# Patient Record
Sex: Male | Born: 1960 | Race: Black or African American | Hispanic: No | Smoking: Never smoker
Health system: Southern US, Community
[De-identification: ages and names within clinical notes are randomized; demographics above are authoritative.]

## PROBLEM LIST (undated history)

## (undated) DIAGNOSIS — R413 Other amnesia: Secondary | ICD-10-CM

## (undated) DIAGNOSIS — R410 Disorientation, unspecified: Secondary | ICD-10-CM

## (undated) DIAGNOSIS — H539 Unspecified visual disturbance: Secondary | ICD-10-CM

## (undated) DIAGNOSIS — F0280 Dementia in other diseases classified elsewhere without behavioral disturbance: Secondary | ICD-10-CM

## (undated) DIAGNOSIS — R51 Headache: Secondary | ICD-10-CM

## (undated) DIAGNOSIS — R519 Headache, unspecified: Secondary | ICD-10-CM

## (undated) DIAGNOSIS — H9190 Unspecified hearing loss, unspecified ear: Secondary | ICD-10-CM

## (undated) DIAGNOSIS — G309 Alzheimer's disease, unspecified: Secondary | ICD-10-CM

## (undated) HISTORY — DX: Unspecified visual disturbance: H53.9

## (undated) HISTORY — PX: OTHER SURGICAL HISTORY: SHX169

## (undated) HISTORY — DX: Disorientation, unspecified: R41.0

## (undated) HISTORY — DX: Headache: R51

## (undated) HISTORY — DX: Other amnesia: R41.3

## (undated) HISTORY — DX: Headache, unspecified: R51.9

## (undated) HISTORY — DX: Unspecified hearing loss, unspecified ear: H91.90

---

## 2005-06-18 ENCOUNTER — Encounter: Admission: RE | Admit: 2005-06-18 | Discharge: 2005-06-18 | Payer: Self-pay | Admitting: Orthopedic Surgery

## 2014-08-23 ENCOUNTER — Emergency Department (HOSPITAL_COMMUNITY): Payer: Self-pay

## 2014-08-23 ENCOUNTER — Emergency Department (HOSPITAL_COMMUNITY)
Admission: EM | Admit: 2014-08-23 | Discharge: 2014-08-23 | Disposition: A | Discharge status: Home / Self Care | Payer: Self-pay | Attending: Emergency Medicine | Admitting: Emergency Medicine

## 2014-08-23 ENCOUNTER — Encounter (HOSPITAL_COMMUNITY): Payer: Self-pay | Admitting: *Deleted

## 2014-08-23 DIAGNOSIS — Z008 Encounter for other general examination: Secondary | ICD-10-CM | POA: Insufficient documentation

## 2014-08-23 DIAGNOSIS — Z590 Homelessness: Secondary | ICD-10-CM | POA: Insufficient documentation

## 2014-08-23 DIAGNOSIS — R413 Other amnesia: Secondary | ICD-10-CM

## 2014-08-23 DIAGNOSIS — R4182 Altered mental status, unspecified: Secondary | ICD-10-CM

## 2014-08-23 DIAGNOSIS — R410 Disorientation, unspecified: Secondary | ICD-10-CM | POA: Insufficient documentation

## 2014-08-23 LAB — CBC
HEMATOCRIT: 42.5 % (ref 39.0–52.0)
Hemoglobin: 13.6 g/dL (ref 13.0–17.0)
MCH: 30 pg (ref 26.0–34.0)
MCHC: 32 g/dL (ref 30.0–36.0)
MCV: 93.8 fL (ref 78.0–100.0)
PLATELETS: 250 10*3/uL (ref 150–400)
RBC: 4.53 MIL/uL (ref 4.22–5.81)
RDW: 13.7 % (ref 11.5–15.5)
WBC: 4.1 10*3/uL (ref 4.0–10.5)

## 2014-08-23 LAB — COMPREHENSIVE METABOLIC PANEL
ALK PHOS: 38 U/L — AB (ref 39–117)
ALT: 19 U/L (ref 0–53)
ANION GAP: 13 (ref 5–15)
AST: 21 U/L (ref 0–37)
Albumin: 4 g/dL (ref 3.5–5.2)
BUN: 11 mg/dL (ref 6–23)
CHLORIDE: 106 meq/L (ref 96–112)
CO2: 28 mEq/L (ref 19–32)
Calcium: 9.7 mg/dL (ref 8.4–10.5)
Creatinine, Ser: 0.97 mg/dL (ref 0.50–1.35)
Glucose, Bld: 118 mg/dL — ABNORMAL HIGH (ref 70–99)
POTASSIUM: 4.1 meq/L (ref 3.7–5.3)
SODIUM: 147 meq/L (ref 137–147)
TOTAL PROTEIN: 7.1 g/dL (ref 6.0–8.3)
Total Bilirubin: 0.7 mg/dL (ref 0.3–1.2)

## 2014-08-23 LAB — TSH: TSH: 4.51 u[IU]/mL — ABNORMAL HIGH (ref 0.350–4.500)

## 2014-08-23 LAB — URINALYSIS, ROUTINE W REFLEX MICROSCOPIC
BILIRUBIN URINE: NEGATIVE
Glucose, UA: NEGATIVE mg/dL
Hgb urine dipstick: NEGATIVE
Ketones, ur: NEGATIVE mg/dL
LEUKOCYTES UA: NEGATIVE
Nitrite: NEGATIVE
Protein, ur: NEGATIVE mg/dL
SPECIFIC GRAVITY, URINE: 1.034 — AB (ref 1.005–1.030)
UROBILINOGEN UA: 1 mg/dL (ref 0.0–1.0)
pH: 6 (ref 5.0–8.0)

## 2014-08-23 NOTE — ED Provider Notes (Signed)
CSN: [id number]     Arrival date & time 08/23/14  1543 History   First MD Initiated Contact with Patient 08/23/14 1808     Chief Complaint  Patient presents with  . Altered Mental Status  . Medical Clearance     (Consider location/radiation/quality/duration/timing/severity/associated sxs/prior Treatment) Patient is a 53 y.o. male presenting with altered mental status. The history is provided by the patient and a relative. No language interpreter was used.  Altered Mental Status Presenting symptoms: confusion, disorientation and memory loss   Severity:  Moderate Most recent episode:  Today Episode history:  Multiple Duration:  12 months Timing:  Constant Progression:  Worsening Chronicity:  Recurrent Context: not alcohol use, not drug use, not a recent change in medication and not a recent illness   Associated symptoms: no headaches     History reviewed. No pertinent past medical history. History reviewed. No pertinent past surgical history. History reviewed. No pertinent family history. History  Substance Use Topics  . Smoking status: Not on file  . Smokeless tobacco: Not on file  . Alcohol Use: No    Review of Systems  Neurological: Negative for headaches.  Psychiatric/Behavioral: Positive for memory loss, confusion and decreased concentration. Negative for suicidal ideas and self-injury.  All other systems reviewed and are negative.     Allergies  Review of patient's allergies indicates no known allergies.  Home Medications   Prior to Admission medications   Not on File   BP 140/88 mmHg  Pulse 54  Temp(Src) 97.8 F (36.6 C) (Oral)  Resp 18  SpO2 100% Physical Exam  Constitutional: He appears well-developed and well-nourished.  HENT:  Head: Normocephalic and atraumatic.  Eyes: Pupils are equal, round, and reactive to light.  Neck: Neck supple. Carotid bruit is not present.  Cardiovascular: Normal rate, regular rhythm and intact distal pulses.    Pulmonary/Chest: Effort normal and breath sounds normal.  Abdominal: Soft. Bowel sounds are normal.  Musculoskeletal: He exhibits no edema or tenderness.  Lymphadenopathy:    He has no cervical adenopathy.  Neurological: He is alert. He has normal strength. No cranial nerve deficit or sensory deficit. Coordination and gait normal. GCS eye subscore is 4. GCS verbal subscore is 5. GCS motor subscore is 6.  Skin: Skin is warm and dry.  Psychiatric: He has a normal mood and affect.  Nursing note and vitals reviewed.   ED Course  Procedures (including critical care time) Labs Review Labs Reviewed  COMPREHENSIVE METABOLIC PANEL - Abnormal; Notable for the following:    Glucose, Bld 118 (*)    Alkaline Phosphatase 38 (*)    All other components within normal limits  CBC  URINALYSIS, ROUTINE W REFLEX MICROSCOPIC    Imaging Review No results found.   EKG Interpretation None     Lab and radiology results reviewed. No acute intracranial findings on MRI. Labs unremarkable except for alk phos of 38.  Current concern for neurologic component of symptoms.  Will confer with neurohospitalist-Dr.Kirkpatrick to see patient. Patient seen by neurohospitalist--recommends outpatient pyschometric testing, and has requested additional labs to aid in next phase of evaluation.  Referral to [facility] neurology provided. MDM   Final diagnoses:  None    Confusion.    Norman Herrlich, NP 08/24/14 0940  Orpah Greek, MD 08/24/14 Pauline Aus

## 2014-08-23 NOTE — ED Notes (Signed)
Pt and family member reports pt having memory loss for extended amount of time, progressively getting worse. Pt had personal stress that may have contributed, went to see a psychiatrist and was told to come here for medical screening. Pt denies any SI or Hi at triage. No acute distress noted.
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
## 2014-08-23 NOTE — ED Notes (Signed)
Patient transported to MRI 

## 2014-08-23 NOTE — Discharge Instructions (Signed)
Confusion Confusion is the inability to think with your usual speed or clarity. Confusion may come on quickly or slowly over time. How quickly the confusion comes on depends on the cause. Confusion can be due to any number of causes. CAUSES   Concussion, head injury, or head trauma.  Seizures.  Stroke.  Fever.  Brain tumor.  Age related decreased brain function (dementia).  Heightened emotional states like rage or terror.  Mental illness in which the person loses the ability to determine what is real and what is not (hallucinations).  Infections such as a urinary tract infection (UTI).  Toxic effects from alcohol, drugs, or prescription medicines.  Dehydration and an imbalance of salts in the body (electrolytes).  Lack of sleep.  Low blood sugar (diabetes).  Low levels of oxygen from conditions such as chronic lung disorders.  Drug interactions or other medicine side effects.  Nutritional deficiencies, especially niacin, thiamine, vitamin C, or vitamin B.  Sudden drop in body temperature (hypothermia).  Change in routine, such as when traveling or hospitalized. SIGNS AND SYMPTOMS  People often describe their thinking as cloudy or unclear when they are confused. Confusion can also include feeling disoriented. That means you are unaware of where or who you are. You may also not know what the date or time is. If confused, you may also have difficulty paying attention, remembering, and making decisions. Some people also act aggressively when they are confused.  DIAGNOSIS  The medical evaluation of confusion may include:  Blood and urine tests.  X-rays.  Brain and nervous system tests.  Analyzing your brain waves (electroencephalogram or EEG).  Magnetic resonance imaging (MRI) of your head.  Computed tomography (CT) scan of your head.  Mental status tests in which your health care provider may ask many questions. Some of these questions may seem silly or strange,  but they are a very important test to help diagnose and treat confusion. TREATMENT  An admission to the hospital may not be needed, but a person with confusion should not be left alone. Stay with a family member or friend until the confusion clears. Avoid alcohol, pain relievers, or sedative drugs until you have fully recovered. Do not drive until directed by your health care provider. HOME CARE INSTRUCTIONS  What family and friends can do:  To find out if someone is confused, ask the person to state his or her name, age, and the date. If the person is unsure or answers incorrectly, he or she is confused.  Always introduce yourself, no matter how well the person knows you.  Often remind the person of his or her location.  Place a calendar and clock near the confused person.  Help the person with his or her medicines. You may want to use a pill box, an alarm as a reminder, or give the person each dose as prescribed.  Talk about current events and plans for the day.  Try to keep the environment calm, quiet, and peaceful.  Make sure the person keeps follow-up visits with his or her health care provider. PREVENTION  Ways to prevent confusion:  Avoid alcohol.  Eat a balanced diet.  Get enough sleep.  Take medicine only as directed by your health care provider.  Do not become isolated. Spend time with other people and make plans for your days.  Keep careful watch on your blood sugar levels if you are diabetic. SEEK IMMEDIATE MEDICAL CARE IF:   You develop severe headaches, repeated vomiting, seizures, blackouts, or   slurred speech.  There is increasing confusion, weakness, numbness, restlessness, or personality changes.  You develop a loss of balance, have marked dizziness, feel uncoordinated, or fall.  You have delusions, hallucinations, or develop severe anxiety.  Your family members think you need to be rechecked. Document Released: 11/11/2004 Document Revised: 02/18/2014  Document Reviewed: 11/09/2013 Exit[employer] Patient Information 2015 [employer]. This information is not intended to replace advice given to you by your health care provider. Make sure you discuss any questions you have with your health care provider.  

## 2014-08-23 NOTE — ED Notes (Signed)
Neruo Md at bedside

## 2014-08-24 DIAGNOSIS — R413 Other amnesia: Secondary | ICD-10-CM

## 2014-08-24 LAB — RPR

## 2014-08-24 LAB — VITAMIN B12: Vitamin B-12: 215 pg/mL (ref 211–911)

## 2014-08-27 ENCOUNTER — Ambulatory Visit (INDEPENDENT_AMBULATORY_CARE_PROVIDER_SITE_OTHER): Payer: Self-pay | Admitting: Neurology

## 2014-08-27 ENCOUNTER — Encounter: Payer: Self-pay | Admitting: Neurology

## 2014-08-27 VITALS — BP 154/75 | HR 55 | Temp 97.0°F | Ht 68.0 in | Wt 146.0 lb

## 2014-08-27 DIAGNOSIS — F329 Major depressive disorder, single episode, unspecified: Secondary | ICD-10-CM

## 2014-08-27 DIAGNOSIS — F32A Depression, unspecified: Secondary | ICD-10-CM

## 2014-08-27 DIAGNOSIS — R410 Disorientation, unspecified: Secondary | ICD-10-CM | POA: Insufficient documentation

## 2014-08-27 DIAGNOSIS — Z658 Other specified problems related to psychosocial circumstances: Secondary | ICD-10-CM

## 2014-08-27 DIAGNOSIS — E538 Deficiency of other specified B group vitamins: Secondary | ICD-10-CM

## 2014-08-27 DIAGNOSIS — R7989 Other specified abnormal findings of blood chemistry: Secondary | ICD-10-CM

## 2014-08-27 DIAGNOSIS — R946 Abnormal results of thyroid function studies: Secondary | ICD-10-CM

## 2014-08-27 NOTE — Patient Instructions (Addendum)
Overall you are doing fairly well but I do want to suggest a few things today:   Remember to drink plenty of fluid, eat healthy meals and do not skip any meals. Try to eat protein with a every meal and eat a healthy snack such as fruit or nuts in between meals. Try to keep a regular sleep-wake schedule and try to exercise daily, particularly in the form of walking, 20-30 minutes a day, if you can.   As far as your medications are concerned, I would like to suggest: daily supplementation with B12 (1000mcg twice daily), thiamine 100mg  daily and folate 1mg /day until can be further tested  As far as diagnostic testing: Will provide recommendations in documentation for sister to bring to free clinic(Will email recommendations to sister,  [email or web address])  I would like to see you back in 6 weeks to 3 months, sooner if we need to. Please call us with any interim questions, concerns, problems, updates or refill requests.   My clinical assistant and will answer any of your questions and relay your messages to me and also relay most of my messages to you.   Our phone number is [phone]. We also have an after hours call service for urgent matters and there is a physician on-call for urgent questions. For any emergencies you know to call 911 or go to the nearest emergency room
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
## 2014-08-28 ENCOUNTER — Ambulatory Visit: Payer: Self-pay | Admitting: Neurology

## 2014-08-28 LAB — VITAMIN B1: VITAMIN B1 (THIAMINE): 8 nmol/L (ref 8–30)

## 2014-11-28 ENCOUNTER — Ambulatory Visit: Payer: Self-pay | Admitting: Neurology

## 2014-12-04 ENCOUNTER — Encounter: Payer: Self-pay | Admitting: Neurology

## 2014-12-24 DIAGNOSIS — Z0289 Encounter for other administrative examinations: Secondary | ICD-10-CM

## 2015-01-24 ENCOUNTER — Ambulatory Visit (INDEPENDENT_AMBULATORY_CARE_PROVIDER_SITE_OTHER): Payer: Self-pay | Admitting: Neurology

## 2015-01-24 ENCOUNTER — Encounter: Payer: Self-pay | Admitting: Neurology

## 2015-01-24 VITALS — BP 131/79 | HR 73 | Temp 97.0°F | Ht 68.0 in | Wt 145.0 lb

## 2015-01-24 DIAGNOSIS — F0390 Unspecified dementia without behavioral disturbance: Secondary | ICD-10-CM
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
## 2015-01-24 NOTE — Patient Instructions (Signed)
Overall you are doing fairly well but I do want to suggest a few things today:   Remember to drink plenty of fluid, eat healthy meals and do not skip any meals. Try to eat protein with a every meal and eat a healthy snack such as fruit or nuts in between meals. Try to keep a regular sleep-wake schedule and try to exercise daily, particularly in the form of walking, 20-30 minutes a day, if you can.   As far as diagnostic testing: MRI of the brain w/wo contrast, FDG-glucose PET scan, Extensive Lab testing, Lumbar puncture, Neurocognitive testing, EEG   I would like to see you back in after testing is complete, sooner if we need to. Please call us with any interim questions, concerns, problems, updates or refill requests.   Please also call us for any test results so we can go over those with you on the phone.  My clinical assistant and will answer any of your questions and relay your messages to me and also relay most of my messages to you.   Our phone number is [phone]. We also have an after hours call service for urgent matters and there is a physician on-call for urgent questions. For any emergencies you know to call 911 or go to the nearest emergency room

## 2015-01-26 DIAGNOSIS — F0390 Unspecified dementia without behavioral disturbance: Secondary | ICD-10-CM | POA: Insufficient documentation

## 2015-01-27 ENCOUNTER — Other Ambulatory Visit (INDEPENDENT_AMBULATORY_CARE_PROVIDER_SITE_OTHER): Payer: Self-pay

## 2015-01-27 DIAGNOSIS — F0390 Unspecified dementia without behavioral disturbance: Secondary | ICD-10-CM

## 2015-01-27 DIAGNOSIS — Z0289 Encounter for other administrative examinations: Secondary | ICD-10-CM

## 2015-01-28 ENCOUNTER — Telehealth: Payer: Self-pay | Admitting: *Deleted

## 2015-01-28 NOTE — Telephone Encounter (Signed)
Spoke with the pt and the sister and informed them that he would need to come back to the office to get some more blood work drawn. Also updated the contact number. They stated and understanding

## 2015-01-30 ENCOUNTER — Other Ambulatory Visit (INDEPENDENT_AMBULATORY_CARE_PROVIDER_SITE_OTHER): Payer: Self-pay

## 2015-01-30 ENCOUNTER — Other Ambulatory Visit: Payer: Self-pay | Admitting: *Deleted

## 2015-01-30 DIAGNOSIS — Z0289 Encounter for other administrative examinations: Secondary | ICD-10-CM

## 2015-01-30 DIAGNOSIS — R413 Other amnesia: Secondary | ICD-10-CM

## 2015-01-30 LAB — COMPREHENSIVE METABOLIC PANEL
A/G RATIO: 1.7 (ref 1.1–2.5)
ALT: 29 IU/L (ref 0–44)
AST: 33 IU/L (ref 0–40)
Albumin: 4.6 g/dL (ref 3.5–5.5)
Alkaline Phosphatase: 42 IU/L (ref 39–117)
BUN/Creatinine Ratio: 12 (ref 9–20)
BUN: 10 mg/dL (ref 6–24)
Bilirubin Total: 0.9 mg/dL (ref 0.0–1.2)
CALCIUM: 9.8 mg/dL (ref 8.7–10.2)
CO2: 28 mmol/L (ref 18–29)
Chloride: 101 mmol/L (ref 97–108)
Creatinine, Ser: 0.86 mg/dL (ref 0.76–1.27)
GFR calc non Af Amer: 99 mL/min/{1.73_m2} (ref >59)
GFR, EST AFRICAN AMERICAN: 114 mL/min/{1.73_m2} (ref >59)
GLOBULIN, TOTAL: 2.7 g/dL (ref 1.5–4.5)
GLUCOSE: 93 mg/dL (ref 65–99)
Potassium: 4.2 mmol/L (ref 3.5–5.2)
Sodium: 143 mmol/L (ref 134–144)
Total Protein: 7.3 g/dL (ref 6.0–8.5)

## 2015-01-30 LAB — MULTIPLE MYELOMA PANEL, SERUM
ALPHA2 GLOB SERPL ELPH-MCNC: 0.5 g/dL (ref 0.4–1.2)
Albumin SerPl Elph-Mcnc: 4.1 g/dL (ref 3.2–5.6)
Albumin/Glob SerPl: 1.3 (ref 0.7–2.0)
Alpha 1: 0.2 g/dL (ref 0.1–0.4)
B-GLOBULIN SERPL ELPH-MCNC: 1.1 g/dL (ref 0.6–1.3)
GAMMA GLOB SERPL ELPH-MCNC: 1.3 g/dL (ref 0.5–1.6)
GLOBULIN, TOTAL: 3.2 g/dL (ref 2.0–4.5)
IGG (IMMUNOGLOBIN G), SERUM: 1235 mg/dL (ref 700–1600)
IGM (IMMUNOGLOBULIN M), SRM: 79 mg/dL (ref 20–172)

## 2015-01-30 LAB — HEAVY METALS, BLOOD
Arsenic: 4 ug/L (ref 2–23)
Lead, Blood: NOT DETECTED ug/dL (ref 0–19)
Mercury: 1.6 ug/L (ref 0.0–14.9)

## 2015-01-30 LAB — ANA W/REFLEX: Anti Nuclear Antibody(ANA): NEGATIVE

## 2015-01-30 LAB — CBC
HEMATOCRIT: 43.5 % (ref 37.5–51.0)
Hemoglobin: 14.2 g/dL (ref 12.6–17.7)
MCH: 29.6 pg (ref 26.6–33.0)
MCHC: 32.6 g/dL (ref 31.5–35.7)
MCV: 91 fL (ref 79–97)
PLATELETS: 244 10*3/uL (ref 150–379)
RBC: 4.8 x10E6/uL (ref 4.14–5.80)
RDW: 13.7 % (ref 12.3–15.4)
WBC: 3.9 10*3/uL (ref 3.4–10.8)

## 2015-01-30 LAB — CELIAC PANEL 10
Antigliadin Abs, IgA: 5 units (ref 0–19)
Endomysial IgA: NEGATIVE
Gliadin IgG: 3 units (ref 0–19)
IgA/Immunoglobulin A, Serum: 309 mg/dL (ref 90–386)
Transglutaminase IgA: <2 U/mL (ref 0–3)

## 2015-01-30 LAB — PAN-ANCA
Atypical pANCA: <1:20 {titer}
C-ANCA: <1:20 {titer}
Myeloperoxidase Ab: <9 U/mL (ref 0.0–9.0)

## 2015-01-30 LAB — HOMOCYSTEINE: Homocysteine: 10.6 umol/L (ref 0.0–15.0)

## 2015-01-30 LAB — METHYLMALONIC ACID, SERUM: Methylmalonic Acid: 160 nmol/L (ref 0–378)

## 2015-01-30 LAB — COPPER, SERUM: Copper: 85 ug/dL (ref 72–166)

## 2015-01-30 LAB — CERULOPLASMIN: Ceruloplasmin: 14.9 mg/dL — ABNORMAL LOW (ref 16.0–31.0)

## 2015-01-30 LAB — B12 AND FOLATE PANEL
Folate: 16.2 ng/mL (ref >3.0)
VITAMIN B 12: 714 pg/mL (ref 211–946)

## 2015-01-30 LAB — THYROID PANEL WITH TSH
Free Thyroxine Index: 1.8 (ref 1.2–4.9)
T3 UPTAKE RATIO: 24 % (ref 24–39)
T4, Total: 7.7 ug/dL (ref 4.5–12.0)
TSH: 2.02 u[IU]/mL (ref 0.450–4.500)

## 2015-01-30 LAB — HIV ANTIBODY (ROUTINE TESTING W REFLEX): HIV Screen 4th Generation wRfx: NONREACTIVE

## 2015-01-30 LAB — ANGIOTENSIN CONVERTING ENZYME: Angio Convert Enzyme: 24 U/L (ref 14–82)

## 2015-01-30 LAB — VITAMIN B1

## 2015-01-30 LAB — PARANEOPLASTIC PROFILE 1
Neuronal Nuc Ab (Ri), IFA: <1:10 {titer}
Neuronal Nuclear (Hu) Antibody (IB): <1:10 {titer}

## 2015-01-30 LAB — SEDIMENTATION RATE: Sed Rate: 2 mm/hr (ref 0–30)

## 2015-01-30 LAB — AMMONIA: AMMONIA: 68 ug/dL (ref 27–102)

## 2015-01-30 LAB — RPR: RPR Ser Ql: NONREACTIVE

## 2015-01-30 LAB — C-REACTIVE PROTEIN: CRP: 0.3 mg/L (ref 0.0–4.9)

## 2015-01-30 LAB — VITAMIN B6: Vitamin B6: 67 ug/L — ABNORMAL HIGH (ref 5.3–46.7)

## 2015-01-30 LAB — MAGNESIUM: Magnesium: 2.2 mg/dL (ref 1.6–2.3)

## 2015-02-01 LAB — VITAMIN B1: Thiamine: 116.3 nmol/L (ref 66.5–200.0)

## 2015-02-03 ENCOUNTER — Other Ambulatory Visit: Payer: Self-pay | Admitting: Neurology

## 2015-02-03 DIAGNOSIS — F0390 Unspecified dementia without behavioral disturbance: Secondary | ICD-10-CM

## 2015-02-04 ENCOUNTER — Telehealth: Payer: Self-pay | Admitting: *Deleted

## 2015-02-04 NOTE — Telephone Encounter (Signed)
Talked with sister about pt lab results per Dr. Lucia GaskinsAhern notes. I told her that the ceruloplasmin was a little low but the copper was normal and Dr. Lucia GaskinsAhern believed this was negligible.  Joy verbalized understanding.

## 2015-02-12 ENCOUNTER — Other Ambulatory Visit: Payer: Self-pay | Admitting: Neurology

## 2015-02-12 ENCOUNTER — Telehealth: Payer: Self-pay | Admitting: Neurology

## 2015-02-12 ENCOUNTER — Ambulatory Visit (HOSPITAL_COMMUNITY)
Admission: RE | Admit: 2015-02-12 | Discharge: 2015-02-12 | Disposition: A | Discharge status: Home / Self Care | Payer: Self-pay | Source: Ambulatory Visit | Attending: Neurology | Admitting: Neurology

## 2015-02-12 ENCOUNTER — Encounter (HOSPITAL_COMMUNITY)
Admission: RE | Admit: 2015-02-12 | Discharge: 2015-02-12 | Disposition: A | Discharge status: Home / Self Care | Payer: Self-pay | Source: Ambulatory Visit | Attending: Neurology | Admitting: Neurology

## 2015-02-12 DIAGNOSIS — G3189 Other specified degenerative diseases of nervous system: Secondary | ICD-10-CM | POA: Insufficient documentation

## 2015-02-12 DIAGNOSIS — R410 Disorientation, unspecified: Secondary | ICD-10-CM | POA: Insufficient documentation

## 2015-02-12 DIAGNOSIS — R413 Other amnesia: Secondary | ICD-10-CM | POA: Insufficient documentation

## 2015-02-12 DIAGNOSIS — F0390 Unspecified dementia without behavioral disturbance: Secondary | ICD-10-CM

## 2015-02-12 DIAGNOSIS — H532 Diplopia: Secondary | ICD-10-CM | POA: Insufficient documentation

## 2015-02-12 MED ORDER — DONEPEZIL HCL 5 MG PO TABS: 5.0000 mg | ORAL_TABLET | Freq: Every day | ORAL | Status: DC

## 2015-02-12 MED ORDER — FLUDEOXYGLUCOSE F - 18 (FDG) INJECTION
10.1000 | Freq: Once | INTRAVENOUS | Status: AC | PRN
  Administered 2015-02-12: 10.1 via INTRAVENOUS

## 2015-02-12 NOTE — Telephone Encounter (Signed)
Talked with Erskine SquibbJane from [facility] and received report about patients PET study they had done. I gave her our fax number: [phone] as well to make sure we received a copy of the report.

## 2015-02-12 NOTE — Telephone Encounter (Signed)
Cassandra from [facility] is calling to give a call report on pt.  Please return her call at [phone] so she can give you the report.

## 2015-02-12 NOTE — Telephone Encounter (Signed)
Received it.thanks

## 2015-02-12 NOTE — Telephone Encounter (Signed)
NM PET scan showed Biparietal and bitemporal decreased relative cortical metabolism, is consistent with an Alzheimer's disease pattern.  Called and spoke to sister. Offered [facility]. She would like to start Aricept 5mg . Hold off on [facility].

## 2015-02-14 NOTE — Telephone Encounter (Signed)
I went online to goodrx.com and printed a discount voucher.  This has been faxed to the pharmacy.  I called the patient back.  Got no answer.  Left message.

## 2015-02-14 NOTE — Telephone Encounter (Signed)
Joy, pt's sister called stating that the Aricept 5mg  is $200 since he has no insurance and would like to know if there is a generic brand that would help with the cost. Please call and advice # [phone]

## 2015-08-01 ENCOUNTER — Encounter (HOSPITAL_COMMUNITY): Payer: Self-pay | Admitting: Family Medicine

## 2015-08-01 ENCOUNTER — Emergency Department (HOSPITAL_COMMUNITY)
Admission: EM | Admit: 2015-08-01 | Discharge: 2015-08-02 | Discharge status: Against Medical Advice | Payer: Medicaid Other | Attending: Emergency Medicine | Admitting: Emergency Medicine

## 2015-08-01 DIAGNOSIS — R251 Tremor, unspecified: Secondary | ICD-10-CM | POA: Insufficient documentation

## 2015-08-01 DIAGNOSIS — R410 Disorientation, unspecified: Secondary | ICD-10-CM | POA: Insufficient documentation

## 2015-08-01 DIAGNOSIS — G4751 Confusional arousals: Secondary | ICD-10-CM | POA: Diagnosis not present

## 2015-08-01 DIAGNOSIS — R42 Dizziness and giddiness: Secondary | ICD-10-CM | POA: Insufficient documentation

## 2015-08-01 DIAGNOSIS — R6883 Chills (without fever): Secondary | ICD-10-CM | POA: Insufficient documentation

## 2015-08-01 DIAGNOSIS — R2689 Other abnormalities of gait and mobility: Secondary | ICD-10-CM | POA: Diagnosis present

## 2015-08-01 LAB — I-STAT CHEM 8, ED
BUN: 6 mg/dL (ref 6–20)
Calcium, Ion: 1.18 mmol/L (ref 1.12–1.23)
Chloride: 101 mmol/L (ref 101–111)
Creatinine, Ser: 0.9 mg/dL (ref 0.61–1.24)
Glucose, Bld: 130 mg/dL — ABNORMAL HIGH (ref 65–99)
HCT: 47 % (ref 39.0–52.0)
Hemoglobin: 16 g/dL (ref 13.0–17.0)
Potassium: 4.1 mmol/L (ref 3.5–5.1)
Sodium: 141 mmol/L (ref 135–145)
TCO2: 26 mmol/L (ref 0–100)

## 2015-08-01 NOTE — ED Notes (Addendum)
Pt here for unsteady gait, shaking,more confused than normal and dizziness. Per family pt habits have been changing over the past few months with not eating so much or moving around. Denies any sickness.

## 2015-08-01 NOTE — ED Notes (Signed)
No answer x3

## 2015-08-02 NOTE — ED Provider Notes (Signed)
I did not see or evaluate this patient.  They left without being seen after triage.  Azalia BilisKevin Onur Mori, MD 08/02/15 [phone]

## 2015-11-10 ENCOUNTER — Telehealth: Payer: Self-pay | Admitting: Neurology

## 2015-11-10 ENCOUNTER — Encounter: Payer: Self-pay | Admitting: *Deleted

## 2015-11-10 NOTE — Telephone Encounter (Signed)
Pts sister called and says her brother is to serve on jury duty on March 2nd. She is requesting that a letter be wrote stating that he can not serve. Please call and advise [phone]

## 2015-11-10 NOTE — Telephone Encounter (Signed)
Letter placed up front for pt pick up.

## 2015-11-10 NOTE — Telephone Encounter (Signed)
Called sister back (ok per DPR). LVM for her to call back. Please let her know letter ready to be picked up in office. Ok per Dr Lucia Gaskins. Gave [facility] phone number.

## 2015-11-10 NOTE — Telephone Encounter (Signed)
Sister Ander Slade returned Logan Middleton's call, advised letter is ready to be picked up.

## 2017-02-08 ENCOUNTER — Telehealth: Payer: Self-pay | Admitting: Neurology

## 2017-02-09 NOTE — Telephone Encounter (Signed)
:  Letter completed, awaiting MD review/signature. 

## 2017-02-15 NOTE — Telephone Encounter (Signed)
Letter signed and mailed to pt's home address

## 2017-04-07 ENCOUNTER — Emergency Department (HOSPITAL_COMMUNITY)
Admission: EM | Admit: 2017-04-07 | Discharge: 2017-04-08 | Disposition: A | Discharge status: Home / Self Care | Payer: Medicare Other | Attending: Emergency Medicine | Admitting: Emergency Medicine

## 2017-04-07 ENCOUNTER — Encounter (HOSPITAL_COMMUNITY): Payer: Self-pay | Admitting: Emergency Medicine

## 2017-04-07 DIAGNOSIS — Z79899 Other long term (current) drug therapy: Secondary | ICD-10-CM | POA: Diagnosis not present

## 2017-04-07 DIAGNOSIS — G309 Alzheimer's disease, unspecified: Secondary | ICD-10-CM | POA: Insufficient documentation

## 2017-04-07 DIAGNOSIS — F0281 Dementia in other diseases classified elsewhere with behavioral disturbance: Secondary | ICD-10-CM | POA: Insufficient documentation

## 2017-04-07 HISTORY — DX: Alzheimer's disease, unspecified: G30.9

## 2017-04-07 HISTORY — DX: Dementia in other diseases classified elsewhere, unspecified severity, without behavioral disturbance, psychotic disturbance, mood disturbance, and anxiety: F02.80

## 2017-04-07 LAB — CBC WITH DIFFERENTIAL/PLATELET
Basophils Absolute: 0 10*3/uL (ref 0.0–0.1)
Basophils Relative: 0 %
Eosinophils Absolute: 0.1 10*3/uL (ref 0.0–0.7)
Eosinophils Relative: 1 %
HCT: 38.4 % — ABNORMAL LOW (ref 39.0–52.0)
Hemoglobin: 12.5 g/dL — ABNORMAL LOW (ref 13.0–17.0)
Lymphocytes Relative: 32 %
Lymphs Abs: 1.6 10*3/uL (ref 0.7–4.0)
MCH: 29.6 pg (ref 26.0–34.0)
MCHC: 32.6 g/dL (ref 30.0–36.0)
MCV: 90.8 fL (ref 78.0–100.0)
Monocytes Absolute: 0.7 10*3/uL (ref 0.1–1.0)
Monocytes Relative: 14 %
Neutro Abs: 2.7 10*3/uL (ref 1.7–7.7)
Neutrophils Relative %: 53 %
Platelets: 227 10*3/uL (ref 150–400)
RBC: 4.23 MIL/uL (ref 4.22–5.81)
RDW: 14.4 % (ref 11.5–15.5)
WBC: 5.1 10*3/uL (ref 4.0–10.5)

## 2017-04-07 LAB — BASIC METABOLIC PANEL
Anion gap: 5 (ref 5–15)
BUN: 17 mg/dL (ref 6–20)
CO2: 28 mmol/L (ref 22–32)
Calcium: 9.3 mg/dL (ref 8.9–10.3)
Chloride: 106 mmol/L (ref 101–111)
Creatinine, Ser: 0.91 mg/dL (ref 0.61–1.24)
GFR calc Af Amer: >60 mL/min (ref >60)
GFR calc non Af Amer: >60 mL/min (ref >60)
Glucose, Bld: 77 mg/dL (ref 65–99)
Potassium: 4 mmol/L (ref 3.5–5.1)
Sodium: 139 mmol/L (ref 135–145)

## 2017-04-07 MED ORDER — RISPERIDONE 1 MG PO TABS
1.0000 mg | ORAL_TABLET | Freq: Every day | ORAL | Status: DC
  Administered 2017-04-08: 1 mg via ORAL
  Filled 2017-04-07 (×2): qty 1

## 2017-04-07 MED ORDER — CARBAMAZEPINE 200 MG PO TABS
200.0000 mg | ORAL_TABLET | Freq: Once | ORAL | Status: AC
  Administered 2017-04-08: 200 mg via ORAL
  Filled 2017-04-07: qty 1

## 2017-04-07 MED ORDER — DONEPEZIL HCL 23 MG PO TABS
23.0000 mg | ORAL_TABLET | Freq: Every day | ORAL | Status: DC
  Administered 2017-04-08: 23 mg via ORAL
  Filled 2017-04-07: qty 1

## 2017-04-07 MED ORDER — HALOPERIDOL 1 MG PO TABS
0.5000 mg | ORAL_TABLET | Freq: Once | ORAL | Status: AC
  Administered 2017-04-08: 0.5 mg via ORAL
  Filled 2017-04-07: qty 1

## 2017-04-07 MED ORDER — SERTRALINE HCL 50 MG PO TABS
50.0000 mg | ORAL_TABLET | Freq: Every day | ORAL | Status: DC
  Administered 2017-04-08: 50 mg via ORAL
  Filled 2017-04-07: qty 1
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
## 2017-04-08 DIAGNOSIS — G309 Alzheimer's disease, unspecified: Secondary | ICD-10-CM | POA: Diagnosis not present

## 2017-04-08 NOTE — Discharge Instructions (Signed)
Begin taking the medications as prescribed by psychiatrist. Please follow-up psychiatrist for management of these medications. Please return to the emergency department if you develop any new or worsening symptoms.

## 2017-04-13 ENCOUNTER — Emergency Department (HOSPITAL_COMMUNITY)
Admission: EM | Admit: 2017-04-13 | Discharge: 2017-04-22 | Disposition: A | Discharge status: Home / Self Care | Payer: Medicare Other | Attending: Emergency Medicine | Admitting: Emergency Medicine

## 2017-04-13 DIAGNOSIS — R4689 Other symptoms and signs involving appearance and behavior: Secondary | ICD-10-CM

## 2017-04-13 DIAGNOSIS — F918 Other conduct disorders: Secondary | ICD-10-CM | POA: Insufficient documentation

## 2017-04-13 DIAGNOSIS — Z810 Family history of intellectual disabilities: Secondary | ICD-10-CM | POA: Diagnosis not present

## 2017-04-13 DIAGNOSIS — F0391 Unspecified dementia with behavioral disturbance: Secondary | ICD-10-CM | POA: Diagnosis not present

## 2017-04-13 DIAGNOSIS — R451 Restlessness and agitation: Secondary | ICD-10-CM | POA: Insufficient documentation

## 2017-04-13 DIAGNOSIS — Z0000 Encounter for general adult medical examination without abnormal findings: Secondary | ICD-10-CM

## 2017-04-13 DIAGNOSIS — F329 Major depressive disorder, single episode, unspecified: Secondary | ICD-10-CM | POA: Insufficient documentation

## 2017-04-13 DIAGNOSIS — G309 Alzheimer's disease, unspecified: Secondary | ICD-10-CM | POA: Diagnosis not present

## 2017-04-13 DIAGNOSIS — F03918 Unspecified dementia, unspecified severity, with other behavioral disturbance: Secondary | ICD-10-CM | POA: Diagnosis present

## 2017-04-13 DIAGNOSIS — F0281 Dementia in other diseases classified elsewhere with behavioral disturbance: Secondary | ICD-10-CM | POA: Diagnosis not present

## 2017-04-13 DIAGNOSIS — R443 Hallucinations, unspecified: Secondary | ICD-10-CM | POA: Diagnosis not present

## 2017-04-13 DIAGNOSIS — R413 Other amnesia: Secondary | ICD-10-CM | POA: Diagnosis not present

## 2017-04-13 LAB — CBC WITH DIFFERENTIAL/PLATELET
Basophils Absolute: 0 10*3/uL (ref 0.0–0.1)
Basophils Relative: 1 %
EOS PCT: 1 %
Eosinophils Absolute: 0 10*3/uL (ref 0.0–0.7)
HCT: 39.2 % (ref 39.0–52.0)
Hemoglobin: 12.8 g/dL — ABNORMAL LOW (ref 13.0–17.0)
LYMPHS ABS: 1.7 10*3/uL (ref 0.7–4.0)
LYMPHS PCT: 30 %
MCH: 28.9 pg (ref 26.0–34.0)
MCHC: 32.7 g/dL (ref 30.0–36.0)
MCV: 88.5 fL (ref 78.0–100.0)
MONO ABS: 0.8 10*3/uL (ref 0.1–1.0)
Monocytes Relative: 14 %
Neutro Abs: 3.2 10*3/uL (ref 1.7–7.7)
Neutrophils Relative %: 54 %
PLATELETS: 248 10*3/uL (ref 150–400)
RBC: 4.43 MIL/uL (ref 4.22–5.81)
RDW: 14.2 % (ref 11.5–15.5)
WBC: 5.7 10*3/uL (ref 4.0–10.5)

## 2017-04-13 LAB — COMPREHENSIVE METABOLIC PANEL
ALT: 96 U/L — AB (ref 17–63)
AST: 62 U/L — AB (ref 15–41)
Albumin: 4.4 g/dL (ref 3.5–5.0)
Alkaline Phosphatase: 53 U/L (ref 38–126)
Anion gap: 9 (ref 5–15)
BUN: 13 mg/dL (ref 6–20)
CHLORIDE: 101 mmol/L (ref 101–111)
CO2: 29 mmol/L (ref 22–32)
CREATININE: 0.93 mg/dL (ref 0.61–1.24)
Calcium: 9.5 mg/dL (ref 8.9–10.3)
GFR calc Af Amer: >60 mL/min (ref >60)
GFR calc non Af Amer: >60 mL/min (ref >60)
GLUCOSE: 117 mg/dL — AB (ref 65–99)
Potassium: 4.1 mmol/L (ref 3.5–5.1)
SODIUM: 139 mmol/L (ref 135–145)
Total Bilirubin: 0.4 mg/dL (ref 0.3–1.2)
Total Protein: 7.4 g/dL (ref 6.5–8.1)

## 2017-04-13 LAB — ETHANOL: Alcohol, Ethyl (B): <5 mg/dL (ref <5)

## 2017-04-13 MED ORDER — HALOPERIDOL 1 MG PO TABS
0.5000 mg | ORAL_TABLET | Freq: Two times a day (BID) | ORAL | Status: DC
  Administered 2017-04-14 – 2017-04-15 (×4): 0.5 mg via ORAL
  Administered 2017-04-16: 10:00:00 via ORAL
  Filled 2017-04-13 (×5): qty 1

## 2017-04-13 MED ORDER — DONEPEZIL HCL 23 MG PO TABS
23.0000 mg | ORAL_TABLET | ORAL | Status: DC
  Administered 2017-04-14 – 2017-04-22 (×9): 23 mg via ORAL
  Filled 2017-04-13 (×10): qty 1

## 2017-04-13 MED ORDER — ACETAMINOPHEN 325 MG PO TABS: 650.0000 mg | ORAL_TABLET | ORAL | Status: DC | PRN

## 2017-04-13 MED ORDER — MEMANTINE HCL ER 7 & 14 & 21 &28 MG PO CP24: 7.0000 mg | ORAL_CAPSULE | ORAL | Status: DC

## 2017-04-13 MED ORDER — RISPERIDONE 1 MG/ML PO SOLN
0.5000 mg | Freq: Every day | ORAL | Status: DC
  Administered 2017-04-13 – 2017-04-15 (×3): 0.5 mg via ORAL
  Filled 2017-04-13 (×3): qty 0.5

## 2017-04-13 MED ORDER — CARBAMAZEPINE 200 MG PO TABS
200.0000 mg | ORAL_TABLET | Freq: Two times a day (BID) | ORAL | Status: DC
  Administered 2017-04-13 – 2017-04-21 (×17): 200 mg via ORAL
  Filled 2017-04-13 (×18): qty 1

## 2017-04-13 MED ORDER — HALOPERIDOL LACTATE 5 MG/ML IJ SOLN
5.0000 mg | Freq: Once | INTRAMUSCULAR | Status: AC
  Administered 2017-04-13: 5 mg via INTRAMUSCULAR
  Filled 2017-04-13: qty 1

## 2017-04-13 MED ORDER — SERTRALINE HCL 50 MG PO TABS
50.0000 mg | ORAL_TABLET | Freq: Every day | ORAL | Status: DC
  Administered 2017-04-13 – 2017-04-21 (×9): 50 mg via ORAL
  Filled 2017-04-13 (×10): qty 1

## 2017-04-13 MED ORDER — ALUM & MAG HYDROXIDE-SIMETH 200-200-20 MG/5ML PO SUSP: 30.0000 mL | Freq: Four times a day (QID) | ORAL | Status: DC | PRN

## 2017-04-13 NOTE — ED Notes (Signed)
Patient urinated before urine sample could be obtained.  Sitter aware that patient needs urine sample.

## 2017-04-13 NOTE — BH Assessment (Addendum)
Assessment Note  Logan Middleton is an 56 y.o. male with past medical history of Alzheimer's Disease. Patient currently resides at [facility] on the memory care unit. He was reportedly IVC'd by family due to increased aggressive behaviors towards other residents and staff. Per ED notes, patient has also been talking to himself in the mirror and talking to people who are there Patient becomes angry and aggressive for no apparent reason. Writer attempted to assess patient but he would only nod his head yes to all questions being asked. He would also mumble in a very soft tone. Writer was unable to interpret the patient's mumbles. Unable to ascertain if patient has suicidal ideations or homicidal ideations. AVH's are unknown. Patient was not responding to internal stimuli at the time of the TTS assessment. Per EPIC hx, patient does not have a psychiatric history. BAL is negative. UDS has not been completed. No documentation of guardianship. Pending collateral information to obtain additional history.   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 Alzheimer disease   . Confusion   . Headache   . Hearing loss   . Memory loss   . Vision abnormalities     Past Surgical History:  Procedure Laterality Date  . none      Family History:  Family History  Problem Relation Age of Onset  . Dementia Mother   . Stroke Mother     Social History:  reports that he has never smoked. He has never used smokeless tobacco. He reports that he does not drink alcohol or use drugs.  Additional Social History:     CIWA: CIWA-Ar BP: (!) 134/53 Pulse Rate: 82 COWS:    Allergies: No Known Allergies  Home Medications:  (Not in a hospital admission)  OB/GYN Status:  No LMP for male patient.  General Assessment Data Location of Assessment: [facility] ED TTS Assessment: In system Is this a Tele or Face-to-Face Assessment?:  Face-to-Face Is this an Initial Assessment or a Re-assessment for this encounter?: Initial Assessment Marital status: Single Maiden name:  (n/a) Is patient pregnant?: No Pregnancy Status: No Living Arrangements: Other (Comment) [facility]([place] memory care unit) Can pt return to current living arrangement?: Yes Admission Status: Involuntary Is patient capable of signing voluntary admission?: Yes Referral Source: Self/Family/Friend Insurance type:  (Medicaid )     Crisis Care Plan Living Arrangements: Other (Comment) [facility] memory care unit) Legal Guardian: Other: Name of Psychiatrist:  (no psychiatrist ) Name of Therapist:  (no therapist )  Education Status Is patient currently in school?: No Current Grade:  (n/a) Highest grade of school patient has completed:  (n/a) Name of school:  (n/a) Contact person:  (n/a)  Risk to self with the past 6 months Suicidal Ideation:  (unk) Has patient been a risk to self within the past 6 months prior to admission? :  (unk) Suicidal Intent:  (unk) Has patient had any suicidal intent within the past 6 months prior to admission? :  (unk) Is patient at risk for suicide?:  (unk) Suicidal Plan?:  (unk) Has patient had any suicidal plan within the past 6 months prior to admission? :  (unk) Access to Means:  (unk) What has been your use of drugs/alcohol within the last 12 months?:  (unk) Previous Attempts/Gestures:  (unk) How many times?:  (unk) Other Self Harm Risks:  (unk) Triggers for Past Attempts: Unknown Intentional Self Injurious Behavior:  (unk)  Family Suicide History: Unknown Recent stressful life event(s):  (unk) Persecutory voices/beliefs?:  (unk) Depression:  (unk) Depression Symptoms:  (unk) Substance abuse history and/or treatment for substance abuse?:  (unk) Suicide prevention information given to non-admitted patients:  (unk)  Risk to Others within the past 6 months Homicidal Ideation:  (unk) Does patient have  any lifetime risk of violence toward others beyond the six months prior to admission? : Unknown Thoughts of Harm to Others:  (unk) Current Homicidal Intent:  (unk) Current Homicidal Plan:  (unk) Access to Homicidal Means:  (unk) Identified Victim:  (unk) History of harm to others?:  (unk) Assessment of Violence:  (unk) Violent Behavior Description:  (calm and cooperaative ) Does patient have access to weapons?: No Criminal Charges Pending?: No Does patient have a court date: No Is patient on probation?: No  Psychosis Hallucinations: None noted Delusions: None noted  Mental Status Report Appearance/Hygiene: Unable to Assess Eye Contact: Unable to Assess Motor Activity: Unable to assess Speech: Unable to assess Level of Consciousness: Unable to assess Mood: Other (Comment) (UTA) Affect: Unable to Assess Anxiety Level:  (unk) Thought Processes: Unable to Assess Judgement: Unable to Assess Orientation: Unable to assess Obsessive Compulsive Thoughts/Behaviors: Unable to Assess  Cognitive Functioning Concentration: Unable to Assess Memory: Unable to Assess IQ: Average Insight: Unable to Assess Impulse Control: Unable to Assess Appetite: Good Weight Loss:  (unk) Weight Gain:  (unk) Sleep: Unable to Assess Total Hours of Sleep:  (unk) Vegetative Symptoms: Unable to Assess  ADLScreening [facility]) Patient's cognitive ability adequate to safely complete daily activities?: Yes Patient able to express need for assistance with ADLs?: No Independently performs ADLs?: Yes (appropriate for developmental age)  Prior Inpatient Therapy Prior Inpatient Therapy:  (UTA) Prior Therapy Dates:  Rich Reining) Prior Therapy Facilty/Provider(s):  (UTA) Reason for Treatment:  (unk)  Prior Outpatient Therapy Prior Outpatient Therapy:  (unk) Prior Therapy Dates:  (unk) Prior Therapy Facilty/Provider(s):  (unk) Reason for Treatment:  (unk) Does patient have an ACCT team?:  Unknown Does patient have Intensive In-House Services?  : Unknown Does patient have [facility] services? : Unknown Does patient have P4CC services?: Unknown  ADL Screening (condition at time of admission) Patient's cognitive ability adequate to safely complete daily activities?: Yes Patient able to express need for assistance with ADLs?: No Independently performs ADLs?: Yes (appropriate for developmental age)             Merchant navy officer (For Healthcare) Does Patient Have a Medical Advance Directive?: No Type of Advance Directive: Environmental manager of Healthcare Power of Attorney in Chart?: No - copy requested Would patient like information on creating a medical advance directive?: No - Patient declined    Additional Information 1:1 In Past 12 Months?: No CIRT Risk: No Elopement Risk: No Does patient have medical clearance?: Yes     Disposition:  Disposition Initial Assessment Completed for this Encounter: Yes Disposition of Patient: Other dispositions (Per Izola Price, NP, observe overnight; am psych evaluation)  On Site Evaluation by:   Reviewed with Physician:    Melynda Ripple 04/13/2017 6:50 PM

## 2017-04-13 NOTE — ED Notes (Signed)
Bed: WA31 Expected date:  Expected time:  Means of arrival:  Comments: 

## 2017-04-13 NOTE — ED Provider Notes (Addendum)
Emergency Department Provider Note   I have reviewed the triage vital signs and the nursing notes.   Level 5 caveat: Alzheimer's disease.   HISTORY  Chief Complaint IVC and Aggressive Behavior   HPI Logan Middleton is a 56 y.o. male with PMH of Alzheimer's disease currently at [facility] in [facility] Care Unit presents to the ED under IVC paperwork for being aggressive with staff. He appears to be talking to himself and apparently the TV. No verbalized SI/HI.   Past Medical History:  Diagnosis Date  . Alzheimer disease   . Confusion   . Headache   . Hearing loss   . Memory loss   . Vision abnormalities     Patient Active Problem List   Diagnosis Date Noted  . 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 Depression 08/27/2014  . Psychosocial stressors 08/27/2014  . Low vitamin B12 level 08/27/2014  . TSH elevation 08/27/2014  . Confusion   . Memory loss 08/24/2014    Past Surgical History:  Procedure Laterality Date  . none      Current Outpatient Rx  . Order #: [id number] Class: Historical Med  . Order #: [id number] Class: Historical Med  . Order #: [id number] Class: Historical Med  . Order #: [id number] Class: Historical Med  . Order #: [id number] Class: Historical Med  . Order #: [id number] Class: Historical Med  . Order #: [id number] Class: Historical Med  . Order #: [id number] Class: Historical Med    Allergies Patient has no known allergies.  Family History  Problem Relation Age of Onset  . Dementia Mother   . Stroke Mother     Social History Social History  Substance Use Topics  . Smoking status: Never Smoker  . Smokeless tobacco: Never Used  . Alcohol use No    Review of Systems  Level 5 caveat: Alzheimer's disease  ____________________________________________   PHYSICAL EXAM:  VITAL SIGNS: ED Triage Vitals [04/13/17 1614]  Enc Vitals Group     BP (!) 134/53     Pulse Rate 82     Resp 18     Temp 98.1 F (36.7 C)     Temp Source Oral    SpO2 100 %   Constitutional: Alert but confused and not engaging in meaningful conversation. Well appearing and in no acute distress. Eyes: Conjunctivae are normal.  Head: Atraumatic. Nose: No congestion/rhinnorhea. Mouth/Throat: Mucous membranes are moist.  Oropharynx non-erythematous. Neck: No stridor.   Cardiovascular: Normal rate, regular rhythm. Good peripheral circulation. Grossly normal heart sounds.   Respiratory: Normal respiratory effort.  No retractions. Lungs CTAB. Gastrointestinal: Soft and nontender. No distention.  Musculoskeletal: No lower extremity tenderness nor edema. No gross deformities of extremities. Neurologic:  Normal speech and language. No gross focal neurologic deficits are appreciated.  Skin:  Skin is warm, dry and intact. No rash noted. Psychiatric: Mood and affect are normal. Patient is calm and responding to directions. He is mumbling to himself during my interview and exam.   ____________________________________________   LABS (all labs ordered are listed, but only abnormal results are displayed)  Labs Reviewed  COMPREHENSIVE METABOLIC PANEL - Abnormal; Notable for the following:       Result Value   Glucose, Bld 117 (*)    AST 62 (*)    ALT 96 (*)    All other components within normal limits  CBC WITH DIFFERENTIAL/PLATELET - Abnormal; Notable for the following:    Hemoglobin 12.8 (*)    All other components within normal limits  URINE CULTURE  ETHANOL  RAPID URINE DRUG SCREEN, HOSP PERFORMED  URINALYSIS, ROUTINE W REFLEX MICROSCOPIC   ____________________________________________   PROCEDURES  Procedure(s) performed:   Procedures  CRITICAL CARE Performed by: Maia Plan Total critical care time: 30 minutes Critical care time was exclusive of separately billable procedures and treating other patients. Critical care was necessary to treat or prevent imminent or life-threatening deterioration. Critical care was time spent personally  by me on the following activities: development of treatment plan with patient and/or surrogate as well as nursing, discussions with consultants, evaluation of patient's response to treatment, examination of patient, obtaining history from patient or surrogate, ordering and performing treatments and interventions, ordering and review of laboratory studies, ordering and review of radiographic studies, pulse oximetry and re-evaluation of patient's condition.  Alona Bene, MD Emergency Medicine  ____________________________________________   INITIAL IMPRESSION / ASSESSMENT AND PLAN / ED COURSE  Pertinent labs & imaging results that were available during my care of the patient were reviewed by me and considered in my medical decision making (see chart for details).  Patient resents to the emergency department for evaluation of aggression towards staff at his memory care unit. He is calm and cooperative here. He was seen in the emergency department 6 days prior with similar event and was ultimately allowed to return to his memory care unit after taking medication here. No report of medication noncompliance. No family at bedside to assist with history taking. I completed the 1st exam IVC paperwork. Will follow labs and reassess.   Patient became agitated with staff. He was not able to be easily redirected verbally. I ordered a one-time Haldol dose IM for acute agitation. Will re-evaluate frequently. Patient in clear view of staff after administration.  05:41 PM Labs reviewed and are largely unremarkable. Will attempt to obtain UA. Restarted home medications and placed order for PRN medication. Placed TTS consult.   TTS has evaluated. Plan for AM psych eval.  ____________________________________________  FINAL CLINICAL IMPRESSION(S) / ED DIAGNOSES  Final diagnoses:  Aggressive behavior     MEDICATIONS GIVEN DURING THIS VISIT:  Medications  acetaminophen (TYLENOL) tablet 650 mg (not  administered)  alum & mag hydroxide-simeth (MAALOX/MYLANTA) 200-200-20 MG/5ML suspension 30 mL (not administered)  donepezil (ARICEPT) tablet 23 mg (not administered)  risperiDONE (RISPERDAL) 1 MG/ML oral solution 0.5 mg (not administered)  sertraline (ZOLOFT) tablet 50 mg (50 mg Oral Given 04/13/17 1828)  haloperidol (HALDOL) tablet 0.5 mg (not administered)  carbamazepine (TEGRETOL) tablet 200 mg (not administered)  haloperidol lactate (HALDOL) injection 5 mg (5 mg Intramuscular Given 04/13/17 1750)     NEW OUTPATIENT MEDICATIONS STARTED DURING THIS VISIT:  None   Note:  This document was prepared using Dragon voice recognition software and may include unintentional dictation errors.  Alona Bene, MD Emergency Medicine    Zyrion Coey, Arlyss Repress, MD 04/13/17 Arletha Grippe    Maia Plan, MD 04/14/17 [phone]

## 2017-04-13 NOTE — ED Notes (Signed)
Pt responds to all questions with "yes".

## 2017-04-13 NOTE — ED Triage Notes (Signed)
Memory care patient [facility] was IVC'd by family for increase aggressive behavior towards other residents and staff.  Patient has been talking to himself in the mirror and talking to people who are not there.  Becomes angry and aggressive for no apparent reason.

## 2017-04-13 NOTE — BH Assessment (Signed)
[facility] Assessment Progress Note   Clinician did talk to [place] [facility] [phone].  She said that patient does mumble to himself.  He gets very angry and today knocked over a table and had gotten some chairs and was swinging them at the CNAs that were coming to assist him.  Suzette BattiestVeronica said that usually patient will calm down when they call his sister to talk to him.  Today that did not work so sister petitioned patient.  Suzette BattiestVeronica said that patient will hit staff and other residents.  She said also that medication did not appear to be helping right now either.  When asked if he could return, she said that would be up to daytime director to talk with about.

## 2017-04-13 NOTE — ED Notes (Signed)
Patient calm now sitting on bed.  Patient took oral medication without difficulty.

## 2017-04-14 ENCOUNTER — Emergency Department (HOSPITAL_COMMUNITY): Payer: Medicare Other

## 2017-04-14 DIAGNOSIS — F918 Other conduct disorders: Secondary | ICD-10-CM | POA: Diagnosis not present

## 2017-04-14 DIAGNOSIS — F03918 Unspecified dementia, unspecified severity, with other behavioral disturbance: Secondary | ICD-10-CM | POA: Diagnosis present

## 2017-04-14 DIAGNOSIS — F0391 Unspecified dementia with behavioral disturbance: Secondary | ICD-10-CM | POA: Diagnosis present

## 2017-04-14 LAB — I-STAT CG4 LACTIC ACID, ED: Lactic Acid, Venous: 1.7 mmol/L (ref 0.5–1.9)

## 2017-04-14 LAB — URINALYSIS, ROUTINE W REFLEX MICROSCOPIC
BILIRUBIN URINE: NEGATIVE
Glucose, UA: NEGATIVE mg/dL
Hgb urine dipstick: NEGATIVE
KETONES UR: NEGATIVE mg/dL
LEUKOCYTES UA: NEGATIVE
Nitrite: NEGATIVE
PH: 6 (ref 5.0–8.0)
PROTEIN: NEGATIVE mg/dL
Specific Gravity, Urine: 1.003 — ABNORMAL LOW (ref 1.005–1.030)

## 2017-04-14 LAB — RAPID URINE DRUG SCREEN, HOSP PERFORMED
Amphetamines: NOT DETECTED
BARBITURATES: NOT DETECTED
Benzodiazepines: NOT DETECTED
Cocaine: NOT DETECTED
OPIATES: NOT DETECTED
TETRAHYDROCANNABINOL: NOT DETECTED

## 2017-04-14 MED ORDER — HALOPERIDOL LACTATE 5 MG/ML IJ SOLN
2.0000 mg | Freq: Once | INTRAMUSCULAR | Status: DC
  Filled 2017-04-14: qty 1

## 2017-04-14 MED ORDER — HALOPERIDOL LACTATE 5 MG/ML IJ SOLN
2.0000 mg | Freq: Once | INTRAMUSCULAR | Status: AC
  Administered 2017-04-14: 2 mg via INTRAMUSCULAR

## 2017-04-14 MED ORDER — LORAZEPAM 1 MG PO TABS
1.0000 mg | ORAL_TABLET | Freq: Four times a day (QID) | ORAL | Status: DC | PRN
  Administered 2017-04-14 – 2017-04-17 (×5): 1 mg via ORAL
  Filled 2017-04-14 (×4): qty 1

## 2017-04-14 NOTE — ED Provider Notes (Signed)
Pt IVC'd and awaiting placement. I was contacted ~1pm for restraint orders for agitation. When I opened this chart review it, I noted that there had been a fever documented at 713 this morning of 100.5 orally. The remainder of his vital signs were normal. Obtain lactate and chest x-ray, both of which were unremarkable. I reviewed his workup which showed normal UA. His repeat temp at 1 PM was 97.8. We will continue to monitor and I have made oncoming providers aware.    Little, Ambrose Finlandachel Morgan, MD 04/14/17 [phone]

## 2017-04-14 NOTE — Progress Notes (Addendum)
CSW called [facility] at the request of disposition and confirmed they had received the pt's referral and were reviewing it at that time.  [facility] need no additional documentation at this time.  Please reconsult if future social work needs arise.  CSW signing off, as social work intervention is no longer needed.  Dorothe PeaJonathan F. Aahna Rossa, Francesco SorLCSWA, LCAS, CSI Clinical Social Worker Ph: [phone]

## 2017-04-14 NOTE — Consult Note (Signed)
[place] [facility] Consult   Reason for Consult:  Alzheimer's dementia Referring Physician:  EDP Patient Identification: Logan Middleton MRN:  [id number] Principal Diagnosis: Dementia with behavioral disturbance Diagnosis:   Patient Active Problem List   Diagnosis Date Noted  . Dementia with behavioral disturbance [F03.91] 04/14/2017  . 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 Depression [F32.9] 08/27/2014  . Psychosocial stressors [Z65.8] 08/27/2014  . Low vitamin B12 level [E53.8] 08/27/2014  . TSH elevation [R94.6] 08/27/2014  . Confusion [R41.0]   . Memory loss [R41.3] 08/24/2014    Total Time spent with patient: 30 minutes  Subjective:   Logan Middleton is a 56 y.o. male patient admitted with advanced dementia with aggressive behavior and combative towards others .  HPI: Logan Middleton is an 56 y.o. male with past medical history of Alzheimer's Disease. Patient currently resides at [facility] on the memory care unit. He was reportedly IVC'd by family due to increased aggressive behaviors towards other residents and staff. Per ED notes, patient has also been talking to himself in the mirror and talking to people who are there Patient becomes angry and aggressive for no apparent reason. Writer attempted to assess patient but he would only nod his head yes to all questions being asked. He would also mumble in a very soft tone. Writer was unable to interpret the patient's mumbles. Unable to ascertain if patient has suicidal ideations or homicidal ideations. AVH's are unknown.  BAL is negative. UDS has not been completed. Pt shut himself in the bathroom today and would not come out. This afternoon Pt became hostile and agitated and was aggressive towards staff, per report. Pt would benefit from inpatient psychiatric care on a Gero-Psych unit.  Past Psychiatric History: Dementia with behavioral disturbance  Risk to Self: Suicidal Ideation:  (unk) Suicidal Intent:   (unk) Is patient at risk for suicide?:  (unk) Suicidal Plan?:  (unk) Access to Means:  (unk) What has been your use of drugs/alcohol within the last 12 months?:  (unk) How many times?:  (unk) Other Self Harm Risks:  (unk) Triggers for Past Attempts: Unknown Intentional Self Injurious Behavior:  (unk) Risk to Others: Homicidal Ideation:  (unk) Thoughts of Harm to Others:  (unk) Current Homicidal Intent:  (unk) Current Homicidal Plan:  (unk) Access to Homicidal Means:  (unk) Identified Victim:  (unk) History of harm to others?:  (unk) Assessment of Violence:  (unk) Violent Behavior Description:  (calm and cooperaative ) Does patient have access to weapons?: No Criminal Charges Pending?: No Does patient have a court date: No Prior Inpatient Therapy: Prior Inpatient Therapy:  (UTA) Prior Therapy Dates:  (UTA) Prior Therapy Facilty/Provider(s):  (UTA) Reason for Treatment:  (unk) Prior Outpatient Therapy: Prior Outpatient Therapy:  (unk) Prior Therapy Dates:  (unk) Prior Therapy Facilty/Provider(s):  (unk) Reason for Treatment:  (unk) Does patient have an ACCT team?: Unknown Does patient have Intensive In-House Services?  : Unknown Does patient have [facility] services? : Unknown Does patient have P4CC services?: Unknown  Past Medical History:  Past Medical History:  Diagnosis Date  . Alzheimer disease   . Confusion   . Headache   . Hearing loss   . Memory loss   . Vision abnormalities     Past Surgical History:  Procedure Laterality Date  . none     Family History:  Family History  Problem Relation Age of Onset  . Dementia Mother   . Stroke Mother    Family Psychiatric  History:  Social History:  History  Alcohol Use No     History  Drug Use No    Social History   Social History  . Marital status: Divorced    Spouse name: N/A  . Number of children: 6  . Years of education: 56   Occupational History  . Unemployed    Social History Main Topics  .  Smoking status: Never Smoker  . Smokeless tobacco: Never Used  . Alcohol use No  . Drug use: No  . Sexual activity: Not on file   Other Topics Concern  . Not on file   Social History Narrative   Lives at home with [place].   Right handed.   Caffeine use: Rarely drinks caffeine.    Additional Social History:    Allergies:  No Known Allergies  Labs:  Results for orders placed or performed during the hospital encounter of 04/13/17 (from the past 48 hour(s))  Comprehensive metabolic panel     Status: Abnormal   Collection Time: 04/13/17  4:10 PM  Result Value Ref Range   Sodium 139 135 - 145 mmol/L   Potassium 4.1 3.5 - 5.1 mmol/L   Chloride 101 101 - 111 mmol/L   CO2 29 22 - 32 mmol/L   Glucose, Bld 117 (H) 65 - 99 mg/dL   BUN 13 6 - 20 mg/dL   Creatinine, Ser 0.93 0.61 - 1.24 mg/dL   Calcium 9.5 8.9 - 10.3 mg/dL   Total Protein 7.4 6.5 - 8.1 g/dL   Albumin 4.4 3.5 - 5.0 g/dL   AST 62 (H) 15 - 41 U/L   ALT 96 (H) 17 - 63 U/L   Alkaline Phosphatase 53 38 - 126 U/L   Total Bilirubin 0.4 0.3 - 1.2 mg/dL   GFR calc non Af Amer >60 >60 mL/min   GFR calc Af Amer >60 >60 mL/min    Comment: (NOTE) The eGFR has been calculated using the CKD EPI equation. This calculation has not been validated in all clinical situations. eGFR's persistently <60 mL/min signify possible Chronic Kidney Disease.    Anion gap 9 5 - 15  Ethanol     Status: None   Collection Time: 04/13/17  4:10 PM  Result Value Ref Range   Alcohol, Ethyl (B) <5 <5 mg/dL    Comment:        LOWEST DETECTABLE LIMIT FOR SERUM ALCOHOL IS 5 mg/dL FOR MEDICAL PURPOSES ONLY   CBC with Diff     Status: Abnormal   Collection Time: 04/13/17  4:10 PM  Result Value Ref Range   WBC 5.7 4.0 - 10.5 K/uL   RBC 4.43 4.22 - 5.81 MIL/uL   Hemoglobin 12.8 (L) 13.0 - 17.0 g/dL   HCT 39.2 39.0 - 52.0 %   MCV 88.5 78.0 - 100.0 fL   MCH 28.9 26.0 - 34.0 pg   MCHC 32.7 30.0 - 36.0 g/dL   RDW 14.2 11.5 - 15.5 %   Platelets 248 150  - 400 K/uL   Neutrophils Relative % 54 %   Neutro Abs 3.2 1.7 - 7.7 K/uL   Lymphocytes Relative 30 %   Lymphs Abs 1.7 0.7 - 4.0 K/uL   Monocytes Relative 14 %   Monocytes Absolute 0.8 0.1 - 1.0 K/uL   Eosinophils Relative 1 %   Eosinophils Absolute 0.0 0.0 - 0.7 K/uL   Basophils Relative 1 %   Basophils Absolute 0.0 0.0 - 0.1 K/uL  Urine rapid drug screen (hosp performed)  Status: None   Collection Time: 04/14/17  1:40 AM  Result Value Ref Range   Opiates NONE DETECTED NONE DETECTED   Cocaine NONE DETECTED NONE DETECTED   Benzodiazepines NONE DETECTED NONE DETECTED   Amphetamines NONE DETECTED NONE DETECTED   Tetrahydrocannabinol NONE DETECTED NONE DETECTED   Barbiturates NONE DETECTED NONE DETECTED    Comment:        DRUG SCREEN FOR MEDICAL PURPOSES ONLY.  IF CONFIRMATION IS NEEDED FOR ANY PURPOSE, NOTIFY LAB WITHIN 5 DAYS.        LOWEST DETECTABLE LIMITS FOR URINE DRUG SCREEN Drug Class       Cutoff (ng/mL) Amphetamine      1000 Barbiturate      200 Benzodiazepine   277 Tricyclics       824 Opiates          300 Cocaine          300 THC              50   Urinalysis, Routine w reflex microscopic     Status: Abnormal   Collection Time: 04/14/17  1:40 AM  Result Value Ref Range   Color, Urine COLORLESS (A) YELLOW   APPearance CLEAR CLEAR   Specific Gravity, Urine 1.003 (L) 1.005 - 1.030   pH 6.0 5.0 - 8.0   Glucose, UA NEGATIVE NEGATIVE mg/dL   Hgb urine dipstick NEGATIVE NEGATIVE   Bilirubin Urine NEGATIVE NEGATIVE   Ketones, ur NEGATIVE NEGATIVE mg/dL   Protein, ur NEGATIVE NEGATIVE mg/dL   Nitrite NEGATIVE NEGATIVE   Leukocytes, UA NEGATIVE NEGATIVE  I-Stat CG4 Lactic Acid, ED     Status: None   Collection Time: 04/14/17  1:10 PM  Result Value Ref Range   Lactic Acid, Venous 1.70 0.5 - 1.9 mmol/L    Current Facility-Administered Medications  Medication Dose Route Frequency Provider Last Rate Last Dose  . acetaminophen (TYLENOL) tablet 650 mg  650 mg Oral  Q4H PRN Long, [facility] Olds, MD      . alum & mag hydroxide-simeth (MAALOX/MYLANTA) 200-200-20 MG/5ML suspension 30 mL  30 mL Oral Q6H PRN Long, Wonda Olds, MD      . carbamazepine (TEGRETOL) tablet 200 mg  200 mg Oral BID Long, Wonda Olds, MD   200 mg at 04/14/17 1018  . donepezil (ARICEPT) tablet 23 mg  23 mg Oral Lenon Curt, MD   23 mg at 04/14/17 0731  . haloperidol (HALDOL) tablet 0.5 mg  0.5 mg Oral BID Margette Fast, MD   0.5 mg at 04/14/17 1018  . risperiDONE (RISPERDAL) 1 MG/ML oral solution 0.5 mg  0.5 mg Oral QHS Long, Wonda Olds, MD   0.5 mg at 04/13/17 2352  . sertraline (ZOLOFT) tablet 50 mg  50 mg Oral Daily Long, Wonda Olds, MD   50 mg at 04/14/17 1018   Current Outpatient Prescriptions  Medication Sig Dispense Refill  . carbamazepine (TEGRETOL) 200 MG tablet Take 200 mg by mouth 2 (two) times daily.    . Cholecalciferol (VITAMIN D) 2000 units CAPS Take 1 capsule by mouth daily.    Marland Kitchen donepezil (ARICEPT) 23 MG TABS tablet Take 23 mg by mouth every morning.    Marland Kitchen ENSURE (ENSURE) Take 237 mLs by mouth 2 (two) times daily between meals.    . haloperidol (HALDOL) 0.5 MG tablet Take 0.5 mg by mouth 2 (two) times daily.    . risperiDONE (RISPERDAL) 1 MG/ML oral solution Take 0.5 mg by mouth  at bedtime.    . sertraline (ZOLOFT) 50 MG tablet Take 50 mg by mouth daily.    . vitamin B-12 (CYANOCOBALAMIN) 1000 MCG tablet Take 1,000 mcg by mouth daily.      Musculoskeletal: Strength & Muscle Tone: within normal limits Gait & Station: normal Patient leans: N/A  Psychiatric Specialty Exam: Physical Exam  Constitutional: He appears well-developed.  HENT:  Head: Normocephalic.  Respiratory: Effort normal.  Musculoskeletal: Normal range of motion.  Neurological: He is alert.  Psychiatric: His affect is labile and inappropriate. His speech is tangential. He is agitated, aggressive and combative. Cognition and memory are impaired. He expresses impulsivity and inappropriate judgment. He  exhibits abnormal recent memory and abnormal remote memory.    Review of Systems  Psychiatric/Behavioral: Positive for memory loss. Negative for depression, hallucinations, substance abuse and suicidal ideas. The patient is not nervous/anxious and does not have insomnia.   All other systems reviewed and are negative.   Blood pressure 138/84, pulse 68, temperature 97.8 F (36.6 C), temperature source Oral, resp. rate 19, SpO2 100 %.There is no height or weight on file to calculate BMI.  General Appearance: Disheveled  Eye Contact:  Poor  Speech:  Pressured  Volume:  Increased  Mood:  Angry, Anxious and Irritable  Affect:  Labile  Thought Process:  Disorganized  Orientation:  Other:  dementia, UTA  Thought Content:  Dementia, UTA  Suicidal Thoughts:  UTA dementia  Homicidal Thoughts:  UTA dementia  Memory:  Immediate;   UTA Dementia Recent;   UTA Dementia Remote;   UTA Dementia  Judgement:  Other:  UTA Dementia  Insight:  UTA Dementia  Psychomotor Activity:  Increased  Concentration:  Concentration: UTA dementia and Attention Span: UTA Dementia  Recall:  UTA Dementia  Fund of Knowledge:  UTA Dementia  Language:  UTA Dementia  Akathisia:  No  Handed:  Right  AIMS (if indicated):     Assets:  Financial Resources/Insurance  ADL's:  Intact  Cognition:  WNL  Sleep:        Treatment Plan Summary: Daily contact with patient to assess and evaluate symptoms and progress in treatment, Medication management and Plan Advanced Dementia  Disposition: Recommend psychiatric Inpatient admission when medically cleared. Pt needs a gero psych bed  Ethelene Hal, NP 04/14/2017 3:24 PM  Patient seen face-to-face for psychiatric evaluation, chart reviewed and case discussed with the physician extender and developed treatment plan. Reviewed the information documented and agree with the treatment plan. Corena Pilgrim, MD

## 2017-04-14 NOTE — BH Assessment (Addendum)
[facility] Assessment Progress Note  Per Thedore MinsMojeed Akintayo, MD, this pt requires psychiatric hospitalization.  Pt presents under IVC initiated by his sister, and upheld by EDP Alona BeneJoshua Long, MD.  At 16:10 this writer spoke to [facility]Natalie [facility] and [facility] authorization [id number] from 04/14/2017 - 04/20/2017.  Please note that authorization does not mean that pt has been accepted to the facility.  At 16:16 I called [facility] and [facility], who accepted demographic information by telephone.  [facility]  [facility], I have requested that this referral be considered for priority status.  As of this writing a final decision is pending.   Doylene Canninghomas Lavona Norsworthy, MA Triage Specialist [phone]   Addendum:  At 16:57 Apolinar JunesBrandon reports that referral is coming through on their fax.   Addendum:  Pt has also been referred to:  [facility]  Response is pending for all.   Doylene Canninghomas Oluwaseun Cremer, MA Triage Specialist [phone]
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
## 2017-04-15 DIAGNOSIS — F918 Other conduct disorders: Secondary | ICD-10-CM | POA: Diagnosis not present

## 2017-04-15 LAB — URINE CULTURE
Culture: NO GROWTH
Special Requests: NORMAL

## 2017-04-15 NOTE — BH Assessment (Addendum)
[facility] Assessment Progress Note  At 14:32 this writer called [facility] and spoke to WeldaWilliam.  He reports that pt has not yet been reviewed, but that he will call me back within 20 minutes.  Doylene Canninghomas Joesph Marcy, MA Triage Specialist [phone]  Addendum:  At 14:37 Chrissie [facility]William [facility]  Pt has been accepted to their wait list as a priority referral.  Doylene Canninghomas Mansi Tokar, MA Triage Specialist [phone]

## 2017-04-15 NOTE — BH Assessment (Signed)
[facility] Assessment Progress Note  At 09:36 this writer called [facility] and spoke to [place] Cla[place].  He reports that they still have referral information.  Review and final decision are pending at this time.  Doylene Canninghomas Osiel Stick, MA Triage Specialist [phone]

## 2017-04-15 NOTE — BH Assessment (Addendum)
[facility] Assessment Progress Note This Clinical research associatewriter spoke with patient this date to re-assess and evaluate treatment progress. Patient will not respond to this writer's questions and does not seem to process the content of this writer's questions. Patient has a history of  advanced dementia and per note review, has been aggressive with staff. Patient is observed to be combative towards others and will not stay in his room. [facility] and was  IVC'd by family due to increased aggressive behaviors towards other residents and staff. Staff nurse reports that patient has been observed talking to himself in the mirror and seems to be responding to internal stimuli. Unable to ascertain if patient has suicidal ideations or homicidal ideations. Per staffing case with Jonnalagadda MD pateint would benefit from continued inpatient psychiatric care on a Gero-Psych unit.

## 2017-04-16 DIAGNOSIS — F918 Other conduct disorders: Secondary | ICD-10-CM | POA: Diagnosis not present

## 2017-04-16 DIAGNOSIS — R413 Other amnesia: Secondary | ICD-10-CM

## 2017-04-16 DIAGNOSIS — F0281 Dementia in other diseases classified elsewhere with behavioral disturbance: Secondary | ICD-10-CM

## 2017-04-16 DIAGNOSIS — Z810 Family history of intellectual disabilities: Secondary | ICD-10-CM | POA: Diagnosis not present

## 2017-04-16 MED ORDER — HALOPERIDOL 1 MG PO TABS
1.0000 mg | ORAL_TABLET | Freq: Two times a day (BID) | ORAL | Status: DC
  Administered 2017-04-16 – 2017-04-17 (×2): 1 mg via ORAL
  Filled 2017-04-16 (×2): qty 1

## 2017-04-16 NOTE — ED Notes (Signed)
Pt standing in front of his room window voiding.  Pt was not re-directable to BR.  Pt cleaned & changed into clean scrubs, floor wiped & Housekeeping called to mop.

## 2017-04-16 NOTE — ED Notes (Signed)
Patient calm and cooperative but still tries to get by sitter to walk the halls and makes a beeline to all the doors trying them to see if he can get out.  Easily directed back to room and taking medication without difficulty.

## 2017-04-16 NOTE — Progress Notes (Signed)
CSW contacted [facility] to inquire about patient's referral. Per staff member [name], patient is on the priority wait list.   Logan SickleKimberly Trashawn Middleton, LCSWA [name]  Clinical Social Worker [phone]

## 2017-04-16 NOTE — Consult Note (Signed)
[facility] Face-to-Face Psychiatry Consult   Reason for Consult:  Alzheimer's dementia Referring Physician:  EDP Patient Identification: Logan Middleton MRN:  [id number] Principal Diagnosis: Dementia with behavioral disturbance Diagnosis:   Patient Active Problem List   Diagnosis Date Noted  . Dementia with behavioral disturbance [F03.91] 04/14/2017  . 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 Depression [F32.9] 08/27/2014  . Psychosocial stressors [Z65.8] 08/27/2014  . Low vitamin B12 level [E53.8] 08/27/2014  . TSH elevation [R94.6] 08/27/2014  . Confusion [R41.0]   . Memory loss [R41.3] 08/24/2014    Total Time spent with patient: 30 minutes  Subjective:   Logan Middleton is a 56 y.o. male patient admitted with advanced dementia with aggressive behavior and combative towards others .  HPI: Logan Middleton is an 56 y.o. male with past medical history of Alzheimer's Disease. Patient currently resides at [facility] on the memory care unit. He was reportedly IVC'd by family due to increased aggressive behaviors towards other residents and staff. Per ED notes, patient has also been talking to himself in the mirror and talking to people who are there Patient becomes angry and aggressive for no apparent reason. Writer attempted to assess patient but he would only nod his head yes to all questions being asked. He would also mumble in a very soft tone. Writer was unable to interpret the patient's mumbles. Unable to ascertain if patient has suicidal ideations or homicidal ideations. AVH's are unknown.  BAL is negative. UDS has not been completed. Pt shut himself in the bathroom today and would not come out. This afternoon Pt became hostile and agitated and was aggressive towards staff, per report. Pt would benefit from inpatient psychiatric care on a Gero-Psych unit.  Continues to be non communicative, slowed responses.    Past Psychiatric History: Dementia with behavioral disturbance  Risk  to Self: Suicidal Ideation:  (unk) Suicidal Intent:  (unk) Is patient at risk for suicide?:  (unk) Suicidal Plan?:  (unk) Access to Means:  (unk) What has been your use of drugs/alcohol within the last 12 months?:  (unk) How many times?:  (unk) Other Self Harm Risks:  (unk) Triggers for Past Attempts: Unknown Intentional Self Injurious Behavior:  (unk) Risk to Others: Homicidal Ideation:  (unk) Thoughts of Harm to Others:  (unk) Current Homicidal Intent:  (unk) Current Homicidal Plan:  (unk) Access to Homicidal Means:  (unk) Identified Victim:  (unk) History of harm to others?:  (unk) Assessment of Violence:  (unk) Violent Behavior Description:  (calm and cooperaative ) Does patient have access to weapons?: No Criminal Charges Pending?: No Does patient have a court date: No Prior Inpatient Therapy: Prior Inpatient Therapy:  (UTA) Prior Therapy Dates:  (UTA) Prior Therapy Facilty/Provider(s):  (UTA) Reason for Treatment:  (unk) Prior Outpatient Therapy: Prior Outpatient Therapy:  (unk) Prior Therapy Dates:  (unk) Prior Therapy Facilty/Provider(s):  (unk) Reason for Treatment:  (unk) Does patient have an ACCT team?: Unknown Does patient have Intensive In-House Services?  : Unknown Does patient have [facility] services? : Unknown Does patient have P4CC services?: Unknown  Past Medical History:  Past Medical History:  Diagnosis Date  . Alzheimer disease   . Confusion   . Headache   . Hearing loss   . Memory loss   . Vision abnormalities     Past Surgical History:  Procedure Laterality Date  . none     Family History:  Family History  Problem Relation Age of Onset  . Dementia Mother   . Stroke Mother  Family Psychiatric  History:  Social History:  History  Alcohol Use No     History  Drug Use No    Social History   Social History  . Marital status: Divorced    Spouse name: N/A  . Number of children: 6  . Years of education: 3512   Occupational History   . Unemployed    Social History Main Topics  . Smoking status: Never Smoker  . Smokeless tobacco: Never Used  . Alcohol use No  . Drug use: No  . Sexual activity: Not on file   Other Topics Concern  . Not on file   Social History Narrative   Lives at home with Joy.   Right handed.   Caffeine use: Rarely drinks caffeine.    Additional Social History:    Allergies:  No Known Allergies  Labs:  No results found for this or any previous visit (from the past 48 hour(s)).  Current Facility-Administered Medications  Medication Dose Route Frequency Provider Last Rate Last Dose  . acetaminophen (TYLENOL) tablet 650 mg  650 mg Oral Q4H PRN Long, Arlyss RepressJoshua G, MD      . alum & mag hydroxide-simeth (MAALOX/MYLANTA) 200-200-20 MG/5ML suspension 30 mL  30 mL Oral Q6H PRN Long, Arlyss RepressJoshua G, MD      . carbamazepine (TEGRETOL) tablet 200 mg  200 mg Oral BID Long, Arlyss RepressJoshua G, MD   200 mg at 04/16/17 0953  . donepezil (ARICEPT) tablet 23 mg  23 mg Oral Lysle MoralesBH-q7a Long, Joshua G, MD   23 mg at 04/16/17 [id number]  . haloperidol (HALDOL) tablet 1 mg  1 mg Oral BID Lynnix Schoneman, MD      . LORazepam (ATIVAN) tablet 1 mg  1 mg Oral Q6H PRN Laveda AbbeParks, Laurie Britton, NP   1 mg at 04/15/17 1316  . sertraline (ZOLOFT) tablet 50 mg  50 mg Oral Daily Long, Arlyss RepressJoshua G, MD   50 mg at 04/16/17 [id number]   Current Outpatient Prescriptions  Medication Sig Dispense Refill  . carbamazepine (TEGRETOL) 200 MG tablet Take 200 mg by mouth 2 (two) times daily.    . Cholecalciferol (VITAMIN D) 2000 units CAPS Take 1 capsule by mouth daily.    Marland Kitchen. donepezil (ARICEPT) 23 MG TABS tablet Take 23 mg by mouth every morning.    Marland Kitchen. ENSURE (ENSURE) Take 237 mLs by mouth 2 (two) times daily between meals.    . haloperidol (HALDOL) 0.5 MG tablet Take 0.5 mg by mouth 2 (two) times daily.    . risperiDONE (RISPERDAL) 1 MG/ML oral solution Take 0.5 mg by mouth at bedtime.    . sertraline (ZOLOFT) 50 MG tablet Take 50 mg by mouth daily.    . vitamin B-12  (CYANOCOBALAMIN) 1000 MCG tablet Take 1,000 mcg by mouth daily.      Musculoskeletal: Strength & Muscle Tone: within normal limits Gait & Station: normal Patient leans: N/A  Psychiatric Specialty Exam: Physical Exam  Vitals reviewed. Constitutional: He appears well-developed.  HENT:  Head: Normocephalic.  Respiratory: Effort normal.  Musculoskeletal: Normal range of motion.  Neurological: He is alert.  Psychiatric: His affect is labile and inappropriate. His speech is tangential. He is agitated, aggressive and combative. Cognition and memory are impaired. He expresses impulsivity and inappropriate judgment. He exhibits abnormal recent memory and abnormal remote memory.    Review of Systems  Psychiatric/Behavioral: Positive for memory loss. Negative for depression, hallucinations, substance abuse and suicidal ideas. The patient is not nervous/anxious and does not have insomnia.  All other systems reviewed and are negative.   Blood pressure 132/77, pulse 69, temperature 97.9 F (36.6 C), temperature source Oral, resp. rate 18, SpO2 97 %.There is no height or weight on file to calculate BMI.  General Appearance: Disheveled  Eye Contact:  Poor  Speech:  Pressured  Volume:  Increased  Mood:  Angry, Anxious and Irritable  Affect:  Labile  Thought Process:  Disorganized  Orientation:  Other:  dementia, UTA  Thought Content:  Dementia, UTA  Suicidal Thoughts:  UTA dementia  Homicidal Thoughts:  UTA dementia  Memory:  Immediate;   UTA Dementia Recent;   UTA Dementia Remote;   UTA Dementia  Judgement:  Other:  UTA Dementia  Insight:  UTA Dementia  Psychomotor Activity:  Increased  Concentration:  Concentration: UTA dementia and Attention Span: UTA Dementia  Recall:  UTA Dementia  Fund of Knowledge:  UTA Dementia  Language:  UTA Dementia  Akathisia:  No  Handed:  Right  AIMS (if indicated):     Assets:  Financial Resources/Insurance  ADL's:  Intact  Cognition:  WNL  Sleep:         Treatment Plan Summary: Daily contact with patient to assess and evaluate symptoms and progress in treatment, Medication management and Plan Advanced Dementia  Disposition: Recommend psychiatric Inpatient admission when medically cleared. Pt needs a gero psych bed  [facility], NP [facility] 04/16/2017 3:03 PM  Patient seen face-to-face for psychiatric evaluation, chart reviewed and case discussed with the physician extender and developed treatment plan. Reviewed the information documented and agree with the treatment plan. Thedore Mins, MD

## 2017-04-17 DIAGNOSIS — F0281 Dementia in other diseases classified elsewhere with behavioral disturbance: Secondary | ICD-10-CM | POA: Diagnosis not present

## 2017-04-17 DIAGNOSIS — F918 Other conduct disorders: Secondary | ICD-10-CM | POA: Diagnosis not present

## 2017-04-17 DIAGNOSIS — Z810 Family history of intellectual disabilities: Secondary | ICD-10-CM | POA: Diagnosis not present

## 2017-04-17 DIAGNOSIS — R443 Hallucinations, unspecified: Secondary | ICD-10-CM

## 2017-04-17 MED ORDER — LORAZEPAM 2 MG/ML IJ SOLN
1.0000 mg | Freq: Once | INTRAMUSCULAR | Status: AC
  Administered 2017-04-17: 1 mg via INTRAMUSCULAR
  Filled 2017-04-17: qty 1

## 2017-04-17 MED ORDER — HALOPERIDOL 2 MG PO TABS
2.0000 mg | ORAL_TABLET | Freq: Two times a day (BID) | ORAL | Status: DC
  Administered 2017-04-17 – 2017-04-21 (×9): 2 mg via ORAL
  Filled 2017-04-17 (×10): qty 1

## 2017-04-17 MED ORDER — STERILE WATER FOR INJECTION IJ SOLN
INTRAMUSCULAR | Status: AC
  Administered 2017-04-17: 0.5 mL
  Filled 2017-04-17: qty 10

## 2017-04-17 NOTE — Consult Note (Signed)
[facility]  04/17/2017 11:42 AM Logan SivaOdell Middleton  MRN:  [id number]   Subjective/Objective: Patient was seen, interviewed , chart reviewed and case discussed with treatment team. Patient is a poor historian with history of Dementia with aggressive behavior who [facility]. Patient reportedly has become increasingly more  aggressive towards  other residents and staff. Patient remains combative, physically aggressive and easily agitated at the ED. He has been  talking to himself in the mirror and talking to people who are not there. Patient has been getting increasingly hostile and agitated with staff. He attempted to run out of the ED twice in the last few days.  Principal Problem: Dementia with behavioral disturbance Diagnosis:   Patient Active Problem List   Diagnosis Date Noted  . Dementia with behavioral disturbance [F03.91] 04/14/2017    Priority: High  . 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 Depression [F32.9] 08/27/2014  . Psychosocial stressors [Z65.8] 08/27/2014  . Low vitamin B12 level [E53.8] 08/27/2014  . TSH elevation [R94.6] 08/27/2014  . Confusion [R41.0]   . Memory loss [R41.3] 08/24/2014   Total Time spent with patient: 30 minutes  Past Psychiatric History: as above  Past Medical History:  Past Medical History:  Diagnosis Date  . Alzheimer disease   . Confusion   . Headache   . Hearing loss   . Memory loss   . Vision abnormalities     Past Surgical History:  Procedure Laterality Date  . none     Family History:  Family History  Problem Relation Age of Onset  . Dementia Mother   . Stroke Mother    Family Psychiatric  History:  Social History:  History  Alcohol Use No     History  Drug Use No    Social History   Social History  . Marital status: Divorced    Spouse name: N/A  . Number of children: 6  . Years of education: 8212   Occupational History  . Unemployed     Social History Main Topics  . Smoking status: Never Smoker  . Smokeless tobacco: Never Used  . Alcohol use No  . Drug use: No  . Sexual activity: Not on file   Other Topics Concern  . Not on file   Social History Narrative   Lives at home with Joy.   Right handed.   Caffeine use: Rarely drinks caffeine.     Sleep: Fair  Appetite:  Fair  Current Medications: Current Facility-Administered Medications  Medication Dose Route Frequency Provider Last Rate Last Dose  . acetaminophen (TYLENOL) tablet 650 mg  650 mg Oral Q4H PRN Long, Arlyss RepressJoshua G, MD      . alum & mag hydroxide-simeth (MAALOX/MYLANTA) 200-200-20 MG/5ML suspension 30 mL  30 mL Oral Q6H PRN Long, Arlyss RepressJoshua G, MD      . carbamazepine (TEGRETOL) tablet 200 mg  200 mg Oral BID Long, Arlyss RepressJoshua G, MD   200 mg at 04/17/17 1026  . donepezil (ARICEPT) tablet 23 mg  23 mg Oral Bartholomew BoardsBH-q7a Long, Arlyss RepressJoshua G, MD   23 mg at 04/17/17 [id number]  . haloperidol (HALDOL) tablet 1 mg  1 mg Oral BID Thedore MinsAkintayo, Jihan Rudy, MD   1 mg at 04/17/17 1025  . LORazepam (ATIVAN) tablet 1 mg  1 mg Oral Q6H PRN Laveda AbbeParks, Laurie Britton, NP   1 mg at 04/16/17 1621  . sertraline (ZOLOFT) tablet 50 mg  50 mg Oral Daily Long,  Arlyss Repress, MD   50 mg at 04/17/17 1026   Current Outpatient Prescriptions  Medication Sig Dispense Refill  . carbamazepine (TEGRETOL) 200 MG tablet Take 200 mg by mouth 2 (two) times daily.    . Cholecalciferol (VITAMIN D) 2000 units CAPS Take 1 capsule by mouth daily.    Marland Kitchen donepezil (ARICEPT) 23 MG TABS tablet Take 23 mg by mouth every morning.    Marland Kitchen ENSURE (ENSURE) Take 237 mLs by mouth 2 (two) times daily between meals.    . haloperidol (HALDOL) 0.5 MG tablet Take 0.5 mg by mouth 2 (two) times daily.    . risperiDONE (RISPERDAL) 1 MG/ML oral solution Take 0.5 mg by mouth at bedtime.    . sertraline (ZOLOFT) 50 MG tablet Take 50 mg by mouth daily.    . vitamin B-12 (CYANOCOBALAMIN) 1000 MCG tablet Take 1,000 mcg by mouth daily.      Lab Results: No  results found for this or any previous visit (from the past 48 hour(s)).  Blood Alcohol level:  Lab Results  Component Value Date   ETH <5 04/13/2017    Physical Findings: AIMS:  , ,  ,  ,    CIWA:    COWS:     Musculoskeletal: Strength & Muscle Tone: within normal limits Gait & Station: normal Patient leans: N/A  Psychiatric Specialty Exam: Physical Exam  Psychiatric: His mood appears anxious. His affect is labile. His speech is delayed and tangential. He is agitated, aggressive, actively hallucinating and combative. Thought content is paranoid. Cognition and memory are impaired. He expresses impulsivity.    Review of Systems  Constitutional: Positive for malaise/fatigue.  HENT: Negative.   Eyes: Negative.   Respiratory: Negative.   Cardiovascular: Negative.   Gastrointestinal: Negative.   Genitourinary: Negative.   Musculoskeletal: Negative.   Skin: Negative.   Neurological: Negative.   Endo/Heme/Allergies: Negative.   Psychiatric/Behavioral: Positive for depression, hallucinations and memory loss. The patient is nervous/anxious.     Blood pressure 103/64, pulse 72, temperature 98.7 F (37.1 C), temperature source Oral, resp. rate 17, SpO2 100 %.There is no height or weight on file to calculate BMI.  General Appearance: Casual  Eye Contact:  Minimal  Speech:  Slow  Volume:  Decreased  Mood:  Irritable  Affect:  Labile  Thought Process:  Disorganized  Orientation:  Other:  only to place  Thought Content:  Illogical and Hallucinations: Auditory  Suicidal Thoughts:  No  Homicidal Thoughts:  No  Memory:  Immediate;   Poor Recent;   Poor Remote;   Fair  Judgement:  Poor  Insight:  Lacking  Psychomotor Activity:  Restlessness  Concentration:  Concentration: Poor and Attention Span: Poor  Recall:  Poor  Fund of Knowledge:  Poor  Language:  Fair  Akathisia:  No  Handed:  Right  AIMS (if indicated):     Assets:  Social Support  ADL's:  Impaired  Cognition:   Impaired,  Moderate  Sleep:   fair      Treatment Plan Summary: Daily contact with patient to assess and evaluate symptoms and progress in treatment and Medication management Continue Zoloft 50 mg daily, Carbamazepine 200 mg bid for mood and Increase Haldol to 2 mg bid for agitated/psychosis.  Thedore Mins, MD 04/17/2017, 11:42 AM

## 2017-04-17 NOTE — ED Notes (Addendum)
Pt attempting to flush gloves down the toilet. Pt become non compliant when asked to return to room. Pt then tried to leave department and escalating with security and staff. Will medicate IM as patient unable to follow directions for taking po meds.

## 2017-04-17 NOTE — ED Notes (Signed)
Pt remains agitated and pacing in room. Pt able to stay in room and be redirected.

## 2017-04-17 NOTE — ED Notes (Signed)
Pt walking around in the room, is messing in the toilet, pulling toilet tissue and placing it in the book he is carrying.  Pt instructed to get in the bed.  Pt continues messing with bed linens.

## 2017-04-17 NOTE — ED Notes (Signed)
Pt with increased agitation. Pt unable to be redirected to stay in room. Pt pacing room and escalating.

## 2017-04-17 NOTE — ED Provider Notes (Signed)
  Physical Exam  BP 103/64 (BP Location: Right Arm)   Pulse 72   Temp 98.7 F (37.1 C) (Oral)   Resp 17   SpO2 100%   Physical Exam  ED Course  Procedures  MDM Patient become more agitated again. Inpatient treatment pending. Will give 1 mg of Ativan IM.      Benjiman CorePickering, Yandell Mcjunkins, MD 04/17/17 1016

## 2017-04-18 DIAGNOSIS — F0391 Unspecified dementia with behavioral disturbance: Secondary | ICD-10-CM

## 2017-04-18 DIAGNOSIS — R443 Hallucinations, unspecified: Secondary | ICD-10-CM | POA: Diagnosis not present

## 2017-04-18 DIAGNOSIS — F918 Other conduct disorders: Secondary | ICD-10-CM | POA: Diagnosis not present

## 2017-04-18 DIAGNOSIS — Z810 Family history of intellectual disabilities: Secondary | ICD-10-CM | POA: Diagnosis not present

## 2017-04-18 DIAGNOSIS — R413 Other amnesia: Secondary | ICD-10-CM | POA: Diagnosis not present

## 2017-04-18 NOTE — BH Assessment (Addendum)
[facility] Assessment Progress Note  At 10:06 V[facility] KotykJay at [facility] reports that this pt remains on their wait list as a priority referral.  He reports that the intake nurse is requesting updated notes, which this writer has agreed to send.  Doylene Canninghomas Dayna Geurts, MA Triage Specialist [phone]   Addendum:  At 12:55 Vonna KotykJay confirms that they have received updated notes.  Final outcome is pending.  Doylene Canninghomas Maekayla Giorgio, MA Triage Specialist [phone]

## 2017-04-18 NOTE — Consult Note (Addendum)
[facility] Psych ED Progress Note  04/18/2017 12:57 PM Logan Middleton  MRN:  [id number]   Subjective/Objective: Patient was seen, interviewed , chart reviewed and case discussed with treatment team. Patient is a poor historian with history of Dementia with aggressive behavior who was brought to [facility] from [facility]  memory care unit. Patient continues to be labile and aggressive despite medications, PRN medications needed frequently.    Principal Problem: Dementia with behavioral disturbance Diagnosis:   Patient Active Problem List   Diagnosis Date Noted  . Dementia with behavioral disturbance [F03.91] 04/14/2017    Priority: High  . 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 Depression [F32.9] 08/27/2014  . Psychosocial stressors [Z65.8] 08/27/2014  . Low vitamin B12 level [E53.8] 08/27/2014  . TSH elevation [R94.6] 08/27/2014  . Confusion [R41.0]   . Memory loss [R41.3] 08/24/2014   Total Time spent with patient: 30 minutes  Past Psychiatric History: as above  Past Medical History:  Past Medical History:  Diagnosis Date  . Alzheimer disease   . Confusion   . Headache   . Hearing loss   . Memory loss   . Vision abnormalities     Past Surgical History:  Procedure Laterality Date  . none     Family History:  Family History  Problem Relation Age of Onset  . Dementia Mother   . Stroke Mother    Family Psychiatric  History:  Social History:  History  Alcohol Use No     History  Drug Use No    Social History   Social History  . Marital status: Divorced    Spouse name: N/A  . Number of children: 6  . Years of education: 49   Occupational History  . Unemployed    Social History Main Topics  . Smoking status: Never Smoker  . Smokeless tobacco: Never Used  . Alcohol use No  . Drug use: No  . Sexual activity: Not on file   Other Topics Concern  . Not on file   Social History Narrative   Lives at home with Joy.   Right handed.   Caffeine use:  Rarely drinks caffeine.     Sleep: Fair  Appetite:  Fair  Current Medications: Current Facility-Administered Medications  Medication Dose Route Frequency Provider Last Rate Last Dose  . acetaminophen (TYLENOL) tablet 650 mg  650 mg Oral Q4H PRN Long, Arlyss Repress, MD      . alum & mag hydroxide-simeth (MAALOX/MYLANTA) 200-200-20 MG/5ML suspension 30 mL  30 mL Oral Q6H PRN Long, Arlyss Repress, MD      . carbamazepine (TEGRETOL) tablet 200 mg  200 mg Oral BID Long, Arlyss Repress, MD   200 mg at 04/18/17 0947  . donepezil (ARICEPT) tablet 23 mg  23 mg Oral Bartholomew Boards, Arlyss Repress, MD   23 mg at 04/18/17 7829  . haloperidol (HALDOL) tablet 2 mg  2 mg Oral BID Thedore Mins, MD   2 mg at 04/18/17 0947  . sertraline (ZOLOFT) tablet 50 mg  50 mg Oral Daily Long, Arlyss Repress, MD   50 mg at 04/18/17 5621   Current Outpatient Prescriptions  Medication Sig Dispense Refill  . carbamazepine (TEGRETOL) 200 MG tablet Take 200 mg by mouth 2 (two) times daily.    . Cholecalciferol (VITAMIN D) 2000 units CAPS Take 1 capsule by mouth daily.    Marland Kitchen donepezil (ARICEPT) 23 MG TABS tablet Take 23 mg by mouth every morning.    Marland Kitchen ENSURE (ENSURE) Take  237 mLs by mouth 2 (two) times daily between meals.    . haloperidol (HALDOL) 0.5 MG tablet Take 0.5 mg by mouth 2 (two) times daily.    . risperiDONE (RISPERDAL) 1 MG/ML oral solution Take 0.5 mg by mouth at bedtime.    . sertraline (ZOLOFT) 50 MG tablet Take 50 mg by mouth daily.    . vitamin B-12 (CYANOCOBALAMIN) 1000 MCG tablet Take 1,000 mcg by mouth daily.      Lab Results: No results found for this or any previous visit (from the past 48 hour(s)).  Blood Alcohol level:  Lab Results  Component Value Date   ETH <5 04/13/2017    Musculoskeletal: Strength & Muscle Tone: within normal limits Gait & Station: normal Patient leans: N/A  Psychiatric Specialty Exam: Physical Exam  Constitutional: He is oriented to person, place, and time. He appears well-developed and  well-nourished.  HENT:  Head: Normocephalic.  Neck: Normal range of motion.  Respiratory: Effort normal.  Musculoskeletal: Normal range of motion.  Neurological: He is alert and oriented to person, place, and time.  Psychiatric: His mood appears anxious. His affect is labile. His speech is delayed and tangential. He is agitated, aggressive, actively hallucinating and combative. Thought content is paranoid. Cognition and memory are impaired. He expresses impulsivity.    Review of Systems  Constitutional: Negative.   HENT: Negative.   Eyes: Negative.   Respiratory: Negative.   Cardiovascular: Negative.   Gastrointestinal: Negative.   Genitourinary: Negative.   Musculoskeletal: Negative.   Skin: Negative.   Neurological: Negative.   Endo/Heme/Allergies: Negative.   Psychiatric/Behavioral: Positive for depression, hallucinations and memory loss. The patient is nervous/anxious.     Blood pressure (!) 104/57, pulse 64, temperature 98.3 F (36.8 C), temperature source Oral, resp. rate 18, SpO2 100 %.There is no height or weight on file to calculate BMI.  General Appearance: Casual  Eye Contact:  Minimal  Speech:  Slow  Volume:  Decreased  Mood:  Irritable  Affect:  Labile  Thought Process:  Disorganized  Orientation:  Other:  only to place  Thought Content:  Illogical and Hallucinations: Auditory  Suicidal Thoughts:  No  Homicidal Thoughts:  No  Memory:  Immediate;   Poor Recent;   Poor Remote;   Fair  Judgement:  Poor  Insight:  Lacking  Psychomotor Activity:  Restlessness  Concentration:  Concentration: Poor and Attention Span: Poor  Recall:  Poor  Fund of Knowledge:  Poor  Language:  Fair  Akathisia:  No  Handed:  Right  AIMS (if indicated):     Assets:  Social Support  ADL's:  Impaired  Cognition:  Impaired,  Moderate  Sleep:   fair      Treatment Plan Summary: Daily contact with patient to assess and evaluate symptoms and progress in treatment and Medication  management Dementia with behavioral disturbance: Continue Zoloft 50 mg daily, Carbamazepine 200 mg bid for mood and increase Haldol to 2 mg bid for agitated/psychosis. -Continue waitlist for [facility]  Nanine MeansLORD, JAMISON, NP 04/18/2017, 12:57 PM  Patient seen face-to-face for psychiatric evaluation, chart reviewed and case discussed with the physician extender and developed treatment plan. Reviewed the information documented and agree with the treatment plan. Thedore MinsMojeed Chet Greenley, MD

## 2017-04-18 NOTE — ED Notes (Signed)
Patient not totally cooperative with bath.  Allowed nurse and tech to wash face, chest, and arms, but would not remove his pants to wash his legs or privates.  Changed patient's linen.  Earlier, patient urinated in the corner of the room.  Patient not totally aware of his surroundings.

## 2017-04-19 DIAGNOSIS — Z810 Family history of intellectual disabilities: Secondary | ICD-10-CM | POA: Diagnosis not present

## 2017-04-19 DIAGNOSIS — F0281 Dementia in other diseases classified elsewhere with behavioral disturbance: Secondary | ICD-10-CM | POA: Diagnosis not present

## 2017-04-19 DIAGNOSIS — R443 Hallucinations, unspecified: Secondary | ICD-10-CM | POA: Diagnosis not present

## 2017-04-19 DIAGNOSIS — F918 Other conduct disorders: Secondary | ICD-10-CM | POA: Diagnosis not present

## 2017-04-19 NOTE — ED Notes (Signed)
Pt sitting up in bed.  Pleasant and cooperative.  Sitter continuing to watch pt.

## 2017-04-19 NOTE — Consult Note (Signed)
[facility] Psych ED Progress Note  04/19/2017 10:03 AM Logan Middleton  MRN:  [id number]   Subjective/Objective: Patient was seen, interviewed , chart reviewed and case discussed with treatment team. Patient appear confused, disoriented, easily agitated and aggressive. Patient has been talking to herself as if responding to internal stimuli. Patient requires constant re-directions due his level of confusion and lack of awareness to his surrounding.   Principal Problem: Dementia with behavioral disturbance Diagnosis:   Patient Active Problem List   Diagnosis Date Noted  . Dementia with behavioral disturbance [F03.91] 04/14/2017    Priority: High  . 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 Depression [F32.9] 08/27/2014  . Psychosocial stressors [Z65.8] 08/27/2014  . Low vitamin B12 level [E53.8] 08/27/2014  . TSH elevation [R94.6] 08/27/2014  . Confusion [R41.0]   . Memory loss [R41.3] 08/24/2014   Total Time spent with patient: 30 minutes  Past Psychiatric History: as above  Past Medical History:  Past Medical History:  Diagnosis Date  . Alzheimer disease   . Confusion   . Headache   . Hearing loss   . Memory loss   . Vision abnormalities     Past Surgical History:  Procedure Laterality Date  . none     Family History:  Family History  Problem Relation Age of Onset  . Dementia Mother   . Stroke Mother    Family Psychiatric  History:  Social History:  History  Alcohol Use No     History  Drug Use No    Social History   Social History  . Marital status: Divorced    Spouse name: N/A  . Number of children: 6  . Years of education: 51   Occupational History  . Unemployed    Social History Main Topics  . Smoking status: Never Smoker  . Smokeless tobacco: Never Used  . Alcohol use No  . Drug use: No  . Sexual activity: Not on file   Other Topics Concern  . Not on file   Social History Narrative   Lives at home with Joy.   Right handed.   Caffeine  use: Rarely drinks caffeine.     Sleep: Fair  Appetite:  Fair  Current Medications: Current Facility-Administered Medications  Medication Dose Route Frequency Provider Last Rate Last Dose  . acetaminophen (TYLENOL) tablet 650 mg  650 mg Oral Q4H PRN Long, Arlyss Repress, MD      . alum & mag hydroxide-simeth (MAALOX/MYLANTA) 200-200-20 MG/5ML suspension 30 mL  30 mL Oral Q6H PRN Long, Arlyss Repress, MD      . carbamazepine (TEGRETOL) tablet 200 mg  200 mg Oral BID Long, Arlyss Repress, MD   200 mg at 04/18/17 2204  . donepezil (ARICEPT) tablet 23 mg  23 mg Oral Lysle Morales, MD   23 mg at 04/19/17 9528  . haloperidol (HALDOL) tablet 2 mg  2 mg Oral BID Thedore Mins, MD   2 mg at 04/18/17 2204  . sertraline (ZOLOFT) tablet 50 mg  50 mg Oral Daily Long, Arlyss Repress, MD   50 mg at 04/18/17 4132   Current Outpatient Prescriptions  Medication Sig Dispense Refill  . carbamazepine (TEGRETOL) 200 MG tablet Take 200 mg by mouth 2 (two) times daily.    . Cholecalciferol (VITAMIN D) 2000 units CAPS Take 1 capsule by mouth daily.    Marland Kitchen donepezil (ARICEPT) 23 MG TABS tablet Take 23 mg by mouth every morning.    Marland Kitchen ENSURE (ENSURE) Take 237 mLs  by mouth 2 (two) times daily between meals.    . haloperidol (HALDOL) 0.5 MG tablet Take 0.5 mg by mouth 2 (two) times daily.    . risperiDONE (RISPERDAL) 1 MG/ML oral solution Take 0.5 mg by mouth at bedtime.    . sertraline (ZOLOFT) 50 MG tablet Take 50 mg by mouth daily.    . vitamin B-12 (CYANOCOBALAMIN) 1000 MCG tablet Take 1,000 mcg by mouth daily.      Lab Results: No results found for this or any previous visit (from the past 48 hour(s)).  Blood Alcohol level:  Lab Results  Component Value Date   ETH <5 04/13/2017    Musculoskeletal: Strength & Muscle Tone: within normal limits Gait & Station: normal Patient leans: N/A  Psychiatric Specialty Exam: Physical Exam  Constitutional: He is oriented to person, place, and time. He appears well-developed  and well-nourished.  HENT:  Head: Normocephalic.  Neck: Normal range of motion.  Respiratory: Effort normal.  Musculoskeletal: Normal range of motion.  Neurological: He is alert and oriented to person, place, and time.  Psychiatric: His mood appears anxious. His affect is labile. His speech is delayed and tangential. He is agitated, aggressive, actively hallucinating and combative. Thought content is paranoid. Cognition and memory are impaired. He expresses impulsivity.    Review of Systems  Constitutional: Negative.   HENT: Negative.   Eyes: Negative.   Respiratory: Negative.   Cardiovascular: Negative.   Gastrointestinal: Negative.   Genitourinary: Negative.   Musculoskeletal: Negative.   Skin: Negative.   Neurological: Negative.   Endo/Heme/Allergies: Negative.   Psychiatric/Behavioral: Positive for depression, hallucinations and memory loss. The patient is nervous/anxious.     Blood pressure 124/71, pulse 76, temperature 98.5 F (36.9 C), temperature source Oral, resp. rate 16, SpO2 100 %.There is no height or weight on file to calculate BMI.  General Appearance: Casual  Eye Contact:  Minimal  Speech:  Slow  Volume:  Decreased  Mood:  Irritable  Affect:  Labile  Thought Process:  Disorganized  Orientation:  Other:  only to place  Thought Content:  Illogical and Hallucinations: Auditory  Suicidal Thoughts:  No  Homicidal Thoughts:  No  Memory:  Immediate;   Poor Recent;   Poor Remote;   Fair  Judgement:  Poor  Insight:  Lacking  Psychomotor Activity:  Restlessness  Concentration:  Concentration: Poor and Attention Span: Poor  Recall:  Poor  Fund of Knowledge:  Poor  Language:  Fair  Akathisia:  No  Handed:  Right  AIMS (if indicated):     Assets:  Social Support  ADL's:  Impaired  Cognition:  Impaired,  Moderate  Sleep:   fair      Treatment Plan Summary: Daily contact with patient to assess and evaluate symptoms and progress in treatment and Medication  management Diagnosis: Dementia with behavioral disturbance -Continue Zoloft 50 mg daily, Carbamazepine 200 mg bid for mood and continue Haldol 2 mg bid for agitation/psychosis. -[facility]  Thedore MinsAkintayo, Patryck Kilgore, MD 04/19/2017, 10:03 AM

## 2017-04-19 NOTE — BH Assessment (Signed)
B[facility] Assessment Progress Note  At 08:23 Maur[facility]rennis [facility] pt remains on their wait list as a priority referral.  Doylene Canninghomas Mayci Haning, MA Triage Specialist [phone]

## 2017-04-20 DIAGNOSIS — F918 Other conduct disorders: Secondary | ICD-10-CM | POA: Diagnosis not present

## 2017-04-20 NOTE — BHH Counselor (Signed)
This Clinical research associatewriter spoke with patient this date to re-assess and evaluate treatment progress. Patient was unresponsive to Clinical research associatewriter.  Patient was standing in his room and folding paper towels.  Patient was not oriented to the time, person, location, or situation.  Patient made statements, such as "I'm going to leave her and go down the street."  Patient began to appear to be agitated and incoherent, when redirected by NT and informed that there was no street, in the hallway.  Patient has a history of advanced dementia and per medical records and has exhibited aggressive behaviors towards staff in the past. Patient walked away and went into the bathroom and shut the door.  Writer was unable to assess SI/HI/AVH/SA or access to weapons.  Labs confirmed to SA.  Elmore GuiseJaniah Skya Mccullum, LPC-A & LCAS-A Therapeutic Triage Specialist [phone]

## 2017-04-20 NOTE — BH Assessment (Signed)
[facility] Assessment Progress Note  At 09:29 [facility]Jay at [facility] pt remains on their wait list as a priority referral.  Doylene Canninghomas Abdulkareem Badolato, MA Triage Specialist [phone]

## 2017-04-20 NOTE — ED Notes (Signed)
Pt Refused to have vs take 3x . Becoming aggressive when asked to apply B/P cuff,

## 2017-04-20 NOTE — BH Assessment (Addendum)
BHH Assessment Progress Note  Per Thedore MinsMojeed Akintayo, MD, this pt continues to require psychiatric hospitalization.  IVC expires today.  Dr Jannifer FranklinAkintayo has initiated a new IVC.  [place] [facility] [place] [facility], and at [employer]11:15 Magistrate Jenkins confirms receipt.  As of this writing, service of Findings and Custody Order is pending.  Doylene Canninghomas Mkenzie Dotts, MA Triage Specialist [phone]   Addendum:  Findings and Custody Order has been served.  Updated IVC, along with updated clinical information, has [facility], and at 13:15 they have confirmed receipt.  Doylene Canninghomas Muneer Leider, MA Triage Specialist [phone]

## 2017-04-21 DIAGNOSIS — Z810 Family history of intellectual disabilities: Secondary | ICD-10-CM | POA: Diagnosis not present

## 2017-04-21 DIAGNOSIS — R443 Hallucinations, unspecified: Secondary | ICD-10-CM | POA: Diagnosis not present

## 2017-04-21 DIAGNOSIS — F918 Other conduct disorders: Secondary | ICD-10-CM | POA: Diagnosis not present

## 2017-04-21 DIAGNOSIS — F0281 Dementia in other diseases classified elsewhere with behavioral disturbance: Secondary | ICD-10-CM | POA: Diagnosis not present

## 2017-04-21 NOTE — Consult Note (Signed)
[facility] Psych ED Progress Note  04/21/2017 10:48 AM Raeshawn Vo  MRN:  [id number]   Subjective/Objective: Patient was seen, interviewed , chart reviewed and case discussed with treatment team. Patient is a poor historian with advanced Dementia who has difficulty processing information. He remains  confused, disoriented to time and place and only oriented to person. Patient wanders around the unit talking to himself as if responding to internal stimuli. He requires several re-directions due to pacing back and forth. He gets easily agitated, irritable and could be physically  Aggressive.  Principal Problem: Dementia with behavioral disturbance Diagnosis:   Patient Active Problem List   Diagnosis Date Noted  . Dementia with behavioral disturbance [F03.91] 04/14/2017    Priority: High  . 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 Depression [F32.9] 08/27/2014  . Psychosocial stressors [Z65.8] 08/27/2014  . Low vitamin B12 level [E53.8] 08/27/2014  . TSH elevation [R94.6] 08/27/2014  . Confusion [R41.0]   . Memory loss [R41.3] 08/24/2014   Total Time spent with patient: 30 minutes  Past Psychiatric History: as above  Past Medical History:  Past Medical History:  Diagnosis Date  . Alzheimer disease   . Confusion   . Headache   . Hearing loss   . Memory loss   . Vision abnormalities     Past Surgical History:  Procedure Laterality Date  . none     Family History:  Family History  Problem Relation Age of Onset  . Dementia Mother   . Stroke Mother    Family Psychiatric  History:  Social History:  History  Alcohol Use No     History  Drug Use No    Social History   Social History  . Marital status: Divorced    Spouse name: N/A  . Number of children: 6  . Years of education: 45   Occupational History  . Unemployed    Social History Main Topics  . Smoking status: Never Smoker  . Smokeless tobacco: Never Used  . Alcohol use No  . Drug use: No  . Sexual  activity: Not on file   Other Topics Concern  . Not on file   Social History Narrative   Lives at home with Joy.   Right handed.   Caffeine use: Rarely drinks caffeine.     Sleep: Fair  Appetite:  Fair  Current Medications: Current Facility-Administered Medications  Medication Dose Route Frequency Provider Last Rate Last Dose  . acetaminophen (TYLENOL) tablet 650 mg  650 mg Oral Q4H PRN Long, Arlyss Repress, MD      . alum & mag hydroxide-simeth (MAALOX/MYLANTA) 200-200-20 MG/5ML suspension 30 mL  30 mL Oral Q6H PRN Long, Arlyss Repress, MD      . carbamazepine (TEGRETOL) tablet 200 mg  200 mg Oral BID Long, Arlyss Repress, MD   200 mg at 04/21/17 0931  . donepezil (ARICEPT) tablet 23 mg  23 mg Oral Lysle Morales, MD   23 mg at 04/21/17 0931  . haloperidol (HALDOL) tablet 2 mg  2 mg Oral BID Thedore Mins, MD   2 mg at 04/21/17 0931  . sertraline (ZOLOFT) tablet 50 mg  50 mg Oral Daily Long, Arlyss Repress, MD   50 mg at 04/21/17 7829   Current Outpatient Prescriptions  Medication Sig Dispense Refill  . carbamazepine (TEGRETOL) 200 MG tablet Take 200 mg by mouth 2 (two) times daily.    . Cholecalciferol (VITAMIN D) 2000 units CAPS Take 1 capsule by mouth daily.    Marland Kitchen  donepezil (ARICEPT) 23 MG TABS tablet Take 23 mg by mouth every morning.    Marland Kitchen. ENSURE (ENSURE) Take 237 mLs by mouth 2 (two) times daily between meals.    . haloperidol (HALDOL) 0.5 MG tablet Take 0.5 mg by mouth 2 (two) times daily.    . risperiDONE (RISPERDAL) 1 MG/ML oral solution Take 0.5 mg by mouth at bedtime.    . sertraline (ZOLOFT) 50 MG tablet Take 50 mg by mouth daily.    . vitamin B-12 (CYANOCOBALAMIN) 1000 MCG tablet Take 1,000 mcg by mouth daily.      Lab Results: No results found for this or any previous visit (from the past 48 hour(s)).  Blood Alcohol level:  Lab Results  Component Value Date   ETH <5 04/13/2017    Musculoskeletal: Strength & Muscle Tone: within normal limits Gait & Station:  normal Patient leans: N/A  Psychiatric Specialty Exam: Physical Exam  Constitutional: He is oriented to person, place, and time. He appears well-developed and well-nourished.  HENT:  Head: Normocephalic.  Neck: Normal range of motion.  Respiratory: Effort normal.  Musculoskeletal: Normal range of motion.  Neurological: He is alert and oriented to person, place, and time.  Psychiatric: His mood appears anxious. His affect is labile. His speech is delayed and tangential. He is agitated, aggressive, actively hallucinating and combative. Thought content is paranoid. Cognition and memory are impaired. He expresses impulsivity.    Review of Systems  Constitutional: Negative.   HENT: Negative.   Eyes: Negative.   Respiratory: Negative.   Cardiovascular: Negative.   Gastrointestinal: Negative.   Genitourinary: Negative.   Musculoskeletal: Negative.   Skin: Negative.   Neurological: Negative.   Endo/Heme/Allergies: Negative.   Psychiatric/Behavioral: Positive for depression, hallucinations and memory loss. The patient is nervous/anxious.     Blood pressure 90/64, pulse 70, temperature 97.8 F (36.6 C), temperature source Oral, resp. rate 16, SpO2 98 %.There is no height or weight on file to calculate BMI.  General Appearance: Casual  Eye Contact:  Minimal  Speech:  Slow  Volume:  Decreased  Mood:  Irritable  Affect:  Labile  Thought Process:  Disorganized  Orientation:  Other:  only to place  Thought Content:  Illogical and Hallucinations: Auditory  Suicidal Thoughts:  No  Homicidal Thoughts:  No  Memory:  Immediate;   Poor Recent;   Poor Remote;   Fair  Judgement:  Poor  Insight:  Lacking  Psychomotor Activity:  Restlessness  Concentration:  Concentration: Poor and Attention Span: Poor  Recall:  Poor  Fund of Knowledge:  Poor  Language:  Fair  Akathisia:  No  Handed:  Right  AIMS (if indicated):     Assets:  Social Support  ADL's:  Impaired  Cognition:  Impaired,   Moderate  Sleep:   fair      Treatment Plan Summary: Daily contact with patient to assess and evaluate symptoms and progress in treatment and Medication management Diagnosis: Dementia with behavioral disturbance -Continue Carbamazepine 200 mg bid for mood. - Haldol 2 mg bid for agitation/psychosis. -Continue Zoloft 50 mg daily. -[facility]  Thedore MinsAkintayo, Arloa Prak, MD 04/21/2017, 10:48 AM

## 2017-04-21 NOTE — BH Assessment (Signed)
BHH Assessment Progress Note  At 10:54 Logan Middleton [facility] on their wait list as a priority referral.  Doylene Canninghomas Lilyann Gravelle, MA Triage Specialist [phone]

## 2017-04-22 DIAGNOSIS — F918 Other conduct disorders: Secondary | ICD-10-CM | POA: Diagnosis not present

## 2017-04-22 DIAGNOSIS — F0391 Unspecified dementia with behavioral disturbance: Secondary | ICD-10-CM | POA: Diagnosis not present

## 2017-04-22 DIAGNOSIS — Z810 Family history of intellectual disabilities: Secondary | ICD-10-CM | POA: Diagnosis not present

## 2017-04-22 DIAGNOSIS — R443 Hallucinations, unspecified: Secondary | ICD-10-CM | POA: Diagnosis not present

## 2017-04-22 DIAGNOSIS — R413 Other amnesia: Secondary | ICD-10-CM | POA: Diagnosis not present

## 2017-04-22 MED ORDER — HALOPERIDOL 2 MG PO TABS: 2.0000 mg | ORAL_TABLET | Freq: Two times a day (BID) | ORAL | 0 refills | Status: AC

## 2017-04-22 MED ORDER — CARBAMAZEPINE 200 MG PO TABS: 200.0000 mg | ORAL_TABLET | Freq: Two times a day (BID) | ORAL | 0 refills | Status: AC

## 2017-04-22 NOTE — BHH Suicide Risk Assessment (Signed)
Suicide Risk Assessment  Discharge Assessment   [facility]   Principal Problem: Dementia with behavioral disturbance Discharge Diagnoses:  Patient Active Problem List   Diagnosis Date Noted  . Dementia with behavioral disturbance [F03.91] 04/14/2017    Priority: High  . 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 Depression [F32.9] 08/27/2014  . Psychosocial stressors [Z65.8] 08/27/2014  . Low vitamin B12 level [E53.8] 08/27/2014  . TSH elevation [R94.6] 08/27/2014  . Confusion [R41.0]   . Memory loss [R41.3] 08/24/2014    Total Time spent with patient: 30 minutes   Musculoskeletal: Strength & Muscle Tone: within normal limits Gait & Station: normal Patient leans: N/A  Psychiatric Specialty Exam: Physical Exam  Constitutional: He is oriented to person, place, and time. He appears well-developed and well-nourished.  HENT:  Head: Normocephalic.  Neck: Normal range of motion.  Cardiovascular: Normal rate.   Respiratory: Effort normal.  Musculoskeletal: Normal range of motion.  Neurological: He is alert and oriented to person, place, and time.  Psychiatric: He has a normal mood and affect. His speech is normal and behavior is normal. Judgment and thought content normal. Cognition and memory are impaired.    Review of Systems  Psychiatric/Behavioral: Positive for memory loss.  All other systems reviewed and are negative.   Blood pressure (!) 118/56, pulse 68, temperature 98.4 F (36.9 C), temperature source Oral, resp. rate 16, SpO2 99 %.There is no height or weight on file to calculate BMI.  General Appearance: Casual  Eye Contact:  Good  Speech:  difficult to understand  Volume:  Normal  Mood:  Euthymic  Affect:  Congruent  Thought Process:  Descriptions of Associations: Loose  Orientation:  Other:  person  Thought Content:  unable to assess per poor verbal communication  Suicidal Thoughts:  No  Homicidal Thoughts:  No   Memory:  unable to assess, poor verbal communication  Judgement:  Fair  Insight:  unable to assess, poor verbal communication  Psychomotor Activity:  Normal  Concentration:  Concentration: Fair and Attention Span: Fair  Recall:  poor  Fund of Knowledge:  Fair  Language:  Poor  Akathisia:  No  Handed:  Right  AIMS (if indicated):     Assets:  Leisure Time Resilience  ADL's:  Impaired  Cognition:  Impaired,  Moderate  Sleep:        Mental Status Per Nursing Assessment::   On Admission:     Demographic Factors:  Male  Loss Factors: NA  Historical Factors: NA  Risk Reduction Factors:   Living with another person, especially a relative and Positive therapeutic relationship  Continued Clinical Symptoms:  None  Cognitive Features That Contribute To Risk:  None    Suicide Risk:  Minimal: No identifiable suicidal ideation.  Patients presenting with no risk factors but with morbid ruminations; may be classified as minimal risk based on the severity of the depressive symptoms    Plan Of Care/Follow-up recommendations:  Activity:  as tolerated Diet:  heart healthy diet  LORD, JAMISON, NP 04/22/2017, 9:45 AM

## 2017-04-22 NOTE — Progress Notes (Signed)
CSW called for  PTAR at 2:38pm. [facility]    Claude MangesKierra S. Kairon Shock, MSW, LCSW-A Emergency Department Clinical Social Worker [phone]

## 2017-04-22 NOTE — [place] FL2 (Signed)
[place] MEDICAID FL2 LEVEL OF CARE SCREENING TOOL     IDENTIFICATION  Patient Name: Logan Middleton Birthdate: 05-15-1961 Sex: male Admission Date (Current Location): 04/13/2017  [facility] and [place] Number:  Producer, television/film/video and Address:  [facility],  [address] [place] [address]      Provider Number: [id number]  Attending Physician Name and Address:  Default, Provider, MD  Relative Name and Phone Number:       Current Level of Care: Hospital Recommended Level of Care: Assisted Living Facility Prior Approval Number:    Date Approved/Denied:   PASRR Number:    Discharge Plan: Other (Comment) (ALF)    Current Diagnoses: Patient Active Problem List   Diagnosis Date Noted  . Dementia with behavioral disturbance 04/14/2017  . 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 Depression 08/27/2014  . Psychosocial stressors 08/27/2014  . Low vitamin B12 level 08/27/2014  . TSH elevation 08/27/2014  . Confusion   . Memory loss 08/24/2014    Orientation RESPIRATION BLADDER Height & Weight     Self  Normal Continent Weight:145   Height:   5'7  BEHAVIORAL SYMPTOMS/MOOD NEUROLOGICAL BOWEL NUTRITION STATUS  Wanderer   Continent Diet (Regular )  AMBULATORY STATUS COMMUNICATION OF NEEDS Skin   Independent Verbally Normal                       Personal Care Assistance Level of Assistance  Bathing, Feeding, Dressing Bathing Assistance: Independent Feeding assistance: Independent Dressing Assistance: Independent     Functional Limitations Info  Sight, Speech, Hearing Sight Info: Adequate Hearing Info: Adequate Speech Info: Adequate    SPECIAL CARE FACTORS FREQUENCY                       Contractures Contractures Info: Not present    Additional Factors Info  Code Status, Allergies Code Status Info: FULL Allergies Info: NKA           Current Medications (04/22/2017):  This is the current hospital active medication list Current  Facility-Administered Medications  Medication Dose Route Frequency Provider Last Rate Last Dose  . acetaminophen (TYLENOL) tablet 650 mg  650 mg Oral Q4H PRN Long, Arlyss Repress, MD      . alum & mag hydroxide-simeth (MAALOX/MYLANTA) 200-200-20 MG/5ML suspension 30 mL  30 mL Oral Q6H PRN Long, Arlyss Repress, MD      . carbamazepine (TEGRETOL) tablet 200 mg  200 mg Oral BID Long, Arlyss Repress, MD   200 mg at 04/21/17 2134  . donepezil (ARICEPT) tablet 23 mg  23 mg Oral Lysle Morales, MD   23 mg at 04/22/17 0640  . haloperidol (HALDOL) tablet 2 mg  2 mg Oral BID Thedore Mins, MD   2 mg at 04/21/17 2134  . sertraline (ZOLOFT) tablet 50 mg  50 mg Oral Daily Long, Arlyss Repress, MD   50 mg at 04/21/17 8119   Current Outpatient Prescriptions  Medication Sig Dispense Refill  . Cholecalciferol (VITAMIN D) 2000 units CAPS Take 1 capsule by mouth daily.    Marland Kitchen donepezil (ARICEPT) 23 MG TABS tablet Take 23 mg by mouth every morning.    Marland Kitchen ENSURE (ENSURE) Take 237 mLs by mouth 2 (two) times daily between meals.    . haloperidol (HALDOL) 0.5 MG tablet Take 0.5 mg by mouth 2 (two) times daily.    . risperiDONE (RISPERDAL) 1 MG/ML oral solution Take 0.5 mg by mouth at bedtime.    Marland Kitchen  sertraline (ZOLOFT) 50 MG tablet Take 50 mg by mouth daily.    . vitamin B-12 (CYANOCOBALAMIN) 1000 MCG tablet Take 1,000 mcg by mouth daily.    . carbamazepine (TEGRETOL) 200 MG tablet Take 1 tablet (200 mg total) by mouth 2 (two) times daily. 60 tablet 0  . haloperidol (HALDOL) 2 MG tablet Take 1 tablet (2 mg total) by mouth 2 (two) times daily. 60 tablet 0     Discharge Medications: Please see discharge summary for a list of discharge medications.  Relevant Imaging Results:  Relevant Lab Results:   Additional Information [id number]  Logan Middleton, LCSWA

## 2017-04-22 NOTE — BH Assessment (Signed)
BHH Assessment Progress Note  At 09:10 [facility] remains on their wait list as a priority referral.  Doylene Canninghomas Daemion Mcniel, MA Triage Specialist [phone]

## 2017-04-22 NOTE — Consult Note (Signed)
[facility] Face-to-Face Psychiatry Consult   Reason for Consult:  Aggression  Referring Physician:  EDP Patient Identification: Logan Middleton MRN:  [id number] Principal Diagnosis: Dementia with behavioral disturbance Diagnosis:   Patient Active Problem List   Diagnosis Date Noted  . Dementia with behavioral disturbance [F03.91] 04/14/2017    Priority: High  . 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 Depression [F32.9] 08/27/2014  . Psychosocial stressors [Z65.8] 08/27/2014  . Low vitamin B12 level [E53.8] 08/27/2014  . TSH elevation [R94.6] 08/27/2014  . Confusion [R41.0]   . Memory loss [R41.3] 08/24/2014    Total Time spent with patient: 30 minutes  Subjective:   Logan Middleton is a 56 y.o. male patient has stabilized.  HPI:  56 yo male who presented to the ED with aggression, history of dementia.  No suicidal/homicidal ideations, hallucinations, and alcohol/drug abuse.  Medications were started in the ED and he has stabilized, calm and cooperative.  No aggression for days, compliant with care.  STable for discharge to his facility.  Past Psychiatric History: dementia  Risk to Self: None Risk to Others: Homicidal Ideation:  (unk) Thoughts of Harm to Others:  (unk) Current Homicidal Intent:  (unk) Current Homicidal Plan:  (unk) Access to Homicidal Means:  (unk) Identified Victim:  (unk) History of harm to others?:  (unk) Assessment of Violence:  (unk) Violent Behavior Description:  (calm and cooperaative ) Does patient have access to weapons?: No Criminal Charges Pending?: No Does patient have a court date: No Prior Inpatient Therapy: Prior Inpatient Therapy:  (UTA) Prior Therapy Dates:  (UTA) Prior Therapy Facilty/Provider(s):  (UTA) Reason for Treatment:  (unk) Prior Outpatient Therapy: Prior Outpatient Therapy:  (unk) Prior Therapy Dates:  (unk) Prior Therapy Facilty/Provider(s):  (unk) Reason for Treatment:  (unk) Does patient have an ACCT team?:  Unknown Does patient have Intensive In-House Services?  : Unknown Does patient have [facility] services? : Unknown Does patient have P4CC services?: Unknown  Past Medical History:  Past Medical History:  Diagnosis Date  . Alzheimer disease   . Confusion   . Headache   . Hearing loss   . Memory loss   . Vision abnormalities     Past Surgical History:  Procedure Laterality Date  . none     Family History:  Family History  Problem Relation Age of Onset  . Dementia Mother   . Stroke Mother    Family Psychiatric  History: unknown Social History:  History  Alcohol Use No     History  Drug Use No    Social History   Social History  . Marital status: Divorced    Spouse name: N/A  . Number of children: 6  . Years of education: 17   Occupational History  . Unemployed    Social History Main Topics  . Smoking status: Never Smoker  . Smokeless tobacco: Never Used  . Alcohol use No  . Drug use: No  . Sexual activity: Not on file   Other Topics Concern  . Not on file   Social History Narrative   Lives at home with Joy.   Right handed.   Caffeine use: Rarely drinks caffeine.    Additional Social History:    Allergies:  No Known Allergies  Labs: No results found for this or any previous visit (from the past 48 hour(s)).  Current Facility-Administered Medications  Medication Dose Route Frequency Provider Last Rate Last Dose  . acetaminophen (TYLENOL) tablet 650 mg  650 mg Oral Q4H PRN Long, Ivin Booty  G, MD      . alum & mag hydroxide-simeth (MAALOX/MYLANTA) 200-200-20 MG/5ML suspension 30 mL  30 mL Oral Q6H PRN Long, Arlyss Repress, MD      . carbamazepine (TEGRETOL) tablet 200 mg  200 mg Oral BID Long, Arlyss Repress, MD   200 mg at 04/21/17 2134  . donepezil (ARICEPT) tablet 23 mg  23 mg Oral Lysle Morales, MD   23 mg at 04/22/17 0640  . haloperidol (HALDOL) tablet 2 mg  2 mg Oral BID Thedore Mins, MD   2 mg at 04/21/17 2134  . sertraline (ZOLOFT) tablet 50 mg  50  mg Oral Daily Long, Arlyss Repress, MD   50 mg at 04/21/17 1610   Current Outpatient Prescriptions  Medication Sig Dispense Refill  . carbamazepine (TEGRETOL) 200 MG tablet Take 200 mg by mouth 2 (two) times daily.    . Cholecalciferol (VITAMIN D) 2000 units CAPS Take 1 capsule by mouth daily.    Marland Kitchen donepezil (ARICEPT) 23 MG TABS tablet Take 23 mg by mouth every morning.    Marland Kitchen ENSURE (ENSURE) Take 237 mLs by mouth 2 (two) times daily between meals.    . haloperidol (HALDOL) 0.5 MG tablet Take 0.5 mg by mouth 2 (two) times daily.    . risperiDONE (RISPERDAL) 1 MG/ML oral solution Take 0.5 mg by mouth at bedtime.    . sertraline (ZOLOFT) 50 MG tablet Take 50 mg by mouth daily.    . vitamin B-12 (CYANOCOBALAMIN) 1000 MCG tablet Take 1,000 mcg by mouth daily.      Musculoskeletal: Strength & Muscle Tone: within normal limits Gait & Station: normal Patient leans: N/A  Psychiatric Specialty Exam: Physical Exam  Constitutional: He is oriented to person, place, and time. He appears well-developed and well-nourished.  HENT:  Head: Normocephalic.  Neck: Normal range of motion.  Cardiovascular: Normal rate.   Respiratory: Effort normal.  Musculoskeletal: Normal range of motion.  Neurological: He is alert and oriented to person, place, and time.  Psychiatric: He has a normal mood and affect. His speech is normal and behavior is normal. Judgment and thought content normal. Cognition and memory are impaired.    Review of Systems  Psychiatric/Behavioral: Positive for memory loss.  All other systems reviewed and are negative.   Blood pressure (!) 118/56, pulse 68, temperature 98.4 F (36.9 C), temperature source Oral, resp. rate 16, SpO2 99 %.There is no height or weight on file to calculate BMI.  General Appearance: Casual  Eye Contact:  Good  Speech:  difficult to understand  Volume:  Normal  Mood:  Euthymic  Affect:  Congruent  Thought Process:  Descriptions of Associations: Loose   Orientation:  Other:  person  Thought Content:  unable to assess per poor verbal communication  Suicidal Thoughts:  No  Homicidal Thoughts:  No  Memory:  unable to assess, poor verbal communication  Judgement:  Fair  Insight:  unable to assess, poor verbal communication  Psychomotor Activity:  Normal  Concentration:  Concentration: Fair and Attention Span: Fair  Recall:  poor  Fund of Knowledge:  Fair  Language:  Poor  Akathisia:  No  Handed:  Right  AIMS (if indicated):     Assets:  Leisure Time Resilience  ADL's:  Impaired  Cognition:  Impaired,  Moderate  Sleep:        Treatment Plan Summary: Daily contact with patient to assess and evaluate symptoms and progress in treatment, Medication management and Plan dementia with behavioral  disturbance:  -Crisis stabilization -Medication management:  Continue Aricept 23 mg daily for dementia along with Tegretol 200 mg BID for mood stabilization, Haldol 2 mg BID for psychosis, Zoloft 50 mg daily for depression,  -Individual counseling -Return to his facility, cleared psychiatrically  Disposition: No evidence of imminent risk to self or others at present.    Nanine MeansLORD, JAMISON, NP 04/22/2017 9:35 AM  Patient seen face-to-face for psychiatric evaluation, chart reviewed and case discussed with the physician extender and developed treatment plan. Reviewed the information documented and agree with the treatment plan. Thedore MinsMojeed Davina Howlett, MD

## 2017-04-22 NOTE — Progress Notes (Signed)
CSW spoke with [facility] Executive Director to confirm that it was okay for pt to return to the facility at the time of discharge. [facility] Director informed CSW that she would have to come out and evaluate pt before saying that pt could return to the facility. CSW updated RN and pt's sister Ander Slade(Joy). At this time CSW and other staff are awaiting the arrive of [facility] Director. CSW will continue to follow up with discharge needs as presented.    Claude MangesKierra S. Jerica Creegan, MSW, LCSW-A Emergency Department Clinical Social Worker [phone]

## 2017-04-22 NOTE — ED Notes (Signed)
Patient is alert and oriented to baseline.  He was given DC back to his nursing home with follow up visit instructions. He was DC ambulatory under his own power to home.  V/S stable.  He was not showing any signs of distress on DC

## 2017-04-22 NOTE — Progress Notes (Signed)
CSW [facility]. Mia requested that CSW send over all new notes on pt as well as an updated FL2. CS[facility] completed a new FL2 [facility] No other concerns have been presented. CSW will continue to assist with discharge needs.      Claude MangesKierra S. Pantelis Elgersma, MSW, LCSW-A Emergency Department Clinical Social Worker [phone]

## 2017-04-25 ENCOUNTER — Emergency Department (HOSPITAL_COMMUNITY)
Admission: EM | Admit: 2017-04-25 | Discharge: 2017-04-27 | Disposition: A | Discharge status: Home / Self Care | Payer: Medicare Other | Attending: Emergency Medicine | Admitting: Emergency Medicine

## 2017-04-25 ENCOUNTER — Encounter (HOSPITAL_COMMUNITY): Payer: Self-pay | Admitting: *Deleted

## 2017-04-25 ENCOUNTER — Emergency Department (HOSPITAL_COMMUNITY): Payer: Medicare Other

## 2017-04-25 DIAGNOSIS — F0281 Dementia in other diseases classified elsewhere with behavioral disturbance: Secondary | ICD-10-CM | POA: Insufficient documentation

## 2017-04-25 DIAGNOSIS — Z79899 Other long term (current) drug therapy: Secondary | ICD-10-CM | POA: Insufficient documentation

## 2017-04-25 DIAGNOSIS — G300 Alzheimer's disease with early onset: Secondary | ICD-10-CM | POA: Insufficient documentation

## 2017-04-25 DIAGNOSIS — F0391 Unspecified dementia with behavioral disturbance: Secondary | ICD-10-CM

## 2017-04-25 DIAGNOSIS — F918 Other conduct disorders: Secondary | ICD-10-CM | POA: Diagnosis present

## 2017-04-25 DIAGNOSIS — F03918 Unspecified dementia, unspecified severity, with other behavioral disturbance: Secondary | ICD-10-CM | POA: Diagnosis present

## 2017-04-25 DIAGNOSIS — Z810 Family history of intellectual disabilities: Secondary | ICD-10-CM | POA: Diagnosis not present

## 2017-04-25 DIAGNOSIS — F02818 Dementia in other diseases classified elsewhere, unspecified severity, with other behavioral disturbance: Secondary | ICD-10-CM

## 2017-04-25 LAB — CBC WITH DIFFERENTIAL/PLATELET
Basophils Absolute: 0 10*3/uL (ref 0.0–0.1)
Basophils Relative: 1 %
EOS ABS: 0 10*3/uL (ref 0.0–0.7)
EOS PCT: 1 %
HCT: 35.5 % — ABNORMAL LOW (ref 39.0–52.0)
Hemoglobin: 11.8 g/dL — ABNORMAL LOW (ref 13.0–17.0)
LYMPHS ABS: 1.4 10*3/uL (ref 0.7–4.0)
LYMPHS PCT: 39 %
MCH: 29.9 pg (ref 26.0–34.0)
MCHC: 33.2 g/dL (ref 30.0–36.0)
MCV: 90.1 fL (ref 78.0–100.0)
MONO ABS: 0.4 10*3/uL (ref 0.1–1.0)
MONOS PCT: 12 %
Neutro Abs: 1.6 10*3/uL — ABNORMAL LOW (ref 1.7–7.7)
Neutrophils Relative %: 47 %
PLATELETS: 221 10*3/uL (ref 150–400)
RBC: 3.94 MIL/uL — AB (ref 4.22–5.81)
RDW: 13.7 % (ref 11.5–15.5)
WBC: 3.5 10*3/uL — AB (ref 4.0–10.5)

## 2017-04-25 LAB — COMPREHENSIVE METABOLIC PANEL
ALK PHOS: 46 U/L (ref 38–126)
ALT: 68 U/L — AB (ref 17–63)
AST: 56 U/L — AB (ref 15–41)
Albumin: 3.8 g/dL (ref 3.5–5.0)
Anion gap: 2 — ABNORMAL LOW (ref 5–15)
BUN: 15 mg/dL (ref 6–20)
CALCIUM: 8.8 mg/dL — AB (ref 8.9–10.3)
CHLORIDE: 107 mmol/L (ref 101–111)
CO2: 32 mmol/L (ref 22–32)
CREATININE: 0.93 mg/dL (ref 0.61–1.24)
GFR calc non Af Amer: >60 mL/min (ref >60)
GLUCOSE: 97 mg/dL (ref 65–99)
Potassium: 4.5 mmol/L (ref 3.5–5.1)
SODIUM: 141 mmol/L (ref 135–145)
Total Bilirubin: 0.2 mg/dL — ABNORMAL LOW (ref 0.3–1.2)
Total Protein: 6.4 g/dL — ABNORMAL LOW (ref 6.5–8.1)

## 2017-04-25 LAB — ETHANOL: Alcohol, Ethyl (B): <5 mg/dL (ref <5)

## 2017-04-25 MED ORDER — IBUPROFEN 200 MG PO TABS: 600.0000 mg | ORAL_TABLET | Freq: Three times a day (TID) | ORAL | Status: DC | PRN

## 2017-04-25 MED ORDER — RISPERIDONE 1 MG/ML PO SOLN
0.5000 mg | Freq: Every day | ORAL | Status: DC
  Administered 2017-04-26: 0.5 mg via ORAL
  Filled 2017-04-25 (×3): qty 0.5

## 2017-04-25 MED ORDER — ALUM & MAG HYDROXIDE-SIMETH 200-200-20 MG/5ML PO SUSP: 30.0000 mL | Freq: Four times a day (QID) | ORAL | Status: DC | PRN

## 2017-04-25 MED ORDER — VITAMIN B-12 1000 MCG PO TABS
1000.0000 ug | ORAL_TABLET | Freq: Every day | ORAL | Status: DC
  Administered 2017-04-26 – 2017-04-27 (×2): 1000 ug via ORAL
  Filled 2017-04-25 (×2): qty 1

## 2017-04-25 MED ORDER — LORAZEPAM 2 MG/ML IJ SOLN
2.0000 mg | Freq: Once | INTRAMUSCULAR | Status: AC
  Administered 2017-04-25: 2 mg via INTRAMUSCULAR
  Filled 2017-04-25: qty 1

## 2017-04-25 MED ORDER — HALOPERIDOL 2 MG PO TABS
2.0000 mg | ORAL_TABLET | Freq: Two times a day (BID) | ORAL | Status: DC
  Administered 2017-04-26 – 2017-04-27 (×4): 2 mg via ORAL
  Filled 2017-04-25 (×4): qty 1

## 2017-04-25 MED ORDER — HALOPERIDOL 1 MG PO TABS
0.5000 mg | ORAL_TABLET | Freq: Two times a day (BID) | ORAL | Status: DC
  Administered 2017-04-26 (×2): 0.5 mg via ORAL
  Filled 2017-04-25 (×2): qty 1

## 2017-04-25 MED ORDER — SERTRALINE HCL 50 MG PO TABS
50.0000 mg | ORAL_TABLET | Freq: Every day | ORAL | Status: DC
  Administered 2017-04-26 – 2017-04-27 (×2): 50 mg via ORAL
  Filled 2017-04-25 (×2): qty 1

## 2017-04-25 MED ORDER — ONDANSETRON HCL 4 MG PO TABS: 4.0000 mg | ORAL_TABLET | Freq: Three times a day (TID) | ORAL | Status: DC | PRN

## 2017-04-25 MED ORDER — VITAMIN D 1000 UNITS PO TABS
2000.0000 [IU] | ORAL_TABLET | Freq: Every day | ORAL | Status: DC
  Administered 2017-04-26 – 2017-04-27 (×2): 2000 [IU] via ORAL
  Filled 2017-04-25 (×2): qty 2

## 2017-04-25 MED ORDER — ZIPRASIDONE MESYLATE 20 MG IM SOLR
10.0000 mg | Freq: Once | INTRAMUSCULAR | Status: AC
  Administered 2017-04-25: 10 mg via INTRAMUSCULAR
  Filled 2017-04-25: qty 20

## 2017-04-25 MED ORDER — DONEPEZIL HCL 23 MG PO TABS
23.0000 mg | ORAL_TABLET | Freq: Every day | ORAL | Status: DC
  Administered 2017-04-26 – 2017-04-27 (×2): 23 mg via ORAL
  Filled 2017-04-25 (×2): qty 1

## 2017-04-25 MED ORDER — ENSURE ENLIVE PO LIQD
237.0000 mL | Freq: Two times a day (BID) | ORAL | Status: DC
  Administered 2017-04-26 – 2017-04-27 (×4): 237 mL via ORAL
  Filled 2017-04-25 (×4): qty 237

## 2017-04-25 MED ORDER — CARBAMAZEPINE 200 MG PO TABS
200.0000 mg | ORAL_TABLET | Freq: Two times a day (BID) | ORAL | Status: DC
  Administered 2017-04-26: 200 mg via ORAL
  Administered 2017-04-26: 100 mg via ORAL
  Administered 2017-04-26 – 2017-04-27 (×2): 200 mg via ORAL
  Filled 2017-04-25 (×4): qty 1
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
## 2017-04-26 DIAGNOSIS — G300 Alzheimer's disease with early onset: Secondary | ICD-10-CM | POA: Diagnosis not present

## 2017-04-26 LAB — URINALYSIS, ROUTINE W REFLEX MICROSCOPIC
Bacteria, UA: NONE SEEN
Bilirubin Urine: NEGATIVE
GLUCOSE, UA: NEGATIVE mg/dL
Hgb urine dipstick: NEGATIVE
Ketones, ur: NEGATIVE mg/dL
NITRITE: NEGATIVE
PH: 7 (ref 5.0–8.0)
Protein, ur: NEGATIVE mg/dL
SPECIFIC GRAVITY, URINE: 1.014 (ref 1.005–1.030)

## 2017-04-26 LAB — RAPID URINE DRUG SCREEN, HOSP PERFORMED
AMPHETAMINES: NOT DETECTED
BARBITURATES: NOT DETECTED
Benzodiazepines: NOT DETECTED
Cocaine: NOT DETECTED
Opiates: NOT DETECTED
TETRAHYDROCANNABINOL: NOT DETECTED

## 2017-04-26 NOTE — ED Notes (Signed)
Patient calm and cooperative at time of medication administration. Patient took ordered PO medications without difficulty.
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
## 2017-04-26 NOTE — ED Notes (Signed)
Patients sister: Reatha HarpsJoy Slade- [phone]

## 2017-04-26 NOTE — ED Notes (Signed)
Bed: [id number] Expected date:  Expected time:  Means of arrival:  Comments: Room 20

## 2017-04-26 NOTE — ED Notes (Signed)
Attempted to release left ankle restraint with sitter at bedside. Patient began to attempt to crawl out of bed. To protect patient safety, restraint was reapplied to ankle. Patient not redirectable.

## 2017-04-26 NOTE — ED Notes (Signed)
Pt resting comfortably with no acute distress.  Chest expansion symmetrical with non-labored breathing.

## 2017-04-26 NOTE — ED Notes (Signed)
Restraints reapplied. Patient attempting to crawl out of bed. Patient not redirectable.

## 2017-04-26 NOTE — Progress Notes (Addendum)
Attempt to speak to the pt and he was lethargic and confused in a way that would be consistent with his known dementia. Pt was evaluated earlier today before we arrived. Will continue his home medication regimen including risperidone and Haldol due to agitation/aggression.   Continue seeking geropsychiatry (ordered EKG, DG 2 view, UA, etc as requested per geropsychiatry facilities)  Beau FannyWithrow, Akela Pocius C, FNP 04/26/2017 5:39 PM

## 2017-04-26 NOTE — ED Notes (Signed)
Patient resting with eyes closed. Equal chest rise and fall noted. Bilateral ankle restraints released at this time. Sitter at bedside. Will continue to reassess.

## 2017-04-27 DIAGNOSIS — F0391 Unspecified dementia with behavioral disturbance: Secondary | ICD-10-CM

## 2017-04-27 DIAGNOSIS — Z810 Family history of intellectual disabilities: Secondary | ICD-10-CM | POA: Diagnosis not present

## 2017-04-27 DIAGNOSIS — G300 Alzheimer's disease with early onset: Secondary | ICD-10-CM | POA: Diagnosis not present

## 2017-04-27 LAB — TSH: TSH: 1.936 u[IU]/mL (ref 0.350–4.500)

## 2017-04-27 MED ORDER — RISPERIDONE 1 MG/ML PO SOLN
1.0000 mg | Freq: Every day | ORAL | Status: DC
  Filled 2017-04-27: qty 1

## 2017-04-27 MED ORDER — RISPERIDONE 1 MG/ML PO SOLN: 1.0000 mg | Freq: Every day | ORAL | 0 refills | Status: AC

## 2017-04-27 NOTE — [facility] Suicide Risk Assessment (Signed)
Suicide Risk Assessment  Discharge Assessment   [facility]   Principal Problem: Dementia with behavioral disturbance Discharge Diagnoses:  Patient Active Problem List   Diagnosis Date Noted  . Dementia with behavioral disturbance [F03.91] 04/14/2017    Priority: High  . 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 Depression [F32.9] 08/27/2014  . Psychosocial stressors [Z65.8] 08/27/2014  . Low vitamin B12 level [E53.8] 08/27/2014  . TSH elevation [R94.6] 08/27/2014  . Confusion [R41.0]   . Memory loss [R41.3] 08/24/2014    Total Time spent with patient: 45 minutes  Musculoskeletal: Strength & Muscle Tone: within normal limits Gait & Station: normal Patient leans: N/A  Psychiatric Specialty Exam: Physical Exam  Constitutional: He appears well-developed.  HENT:  Head: Normocephalic.  Neck: Normal range of motion.  Respiratory: Effort normal.  Musculoskeletal: Normal range of motion.  Neurological: He is alert.  Psychiatric: He has a normal mood and affect. His speech is normal and behavior is normal. Thought content normal. Cognition and memory are impaired. He expresses impulsivity.    Review of Systems  Psychiatric/Behavioral: Positive for memory loss.  All other systems reviewed and are negative.   Blood pressure 135/77, pulse 72, temperature 98 F (36.7 C), temperature source Oral, resp. rate (!) 21, height 5\' 7"  (1.702 m), weight 65.8 kg (145 lb), SpO2 98 %.Body mass index is 22.71 kg/m.  General Appearance: Casual  Eye Contact:  Good  Speech:  minimal speech  Volume:  Normal  Mood:  Euthymic  Affect:  Congruent  Thought Process:  Minimal verbal communication, unable to assess  Orientation:  Other:  person  Thought Content:  Minimal verbal communication, unable to assess  Suicidal Thoughts:  No  Homicidal Thoughts:  No  Memory:  Immediate;   Poor Recent;   Poor Remote;   Poor  Judgement:  Fair  Insight:  Lacking   Psychomotor Activity:  Normal  Concentration:  Concentration: Fair and Attention Span: Fair  Recall:  Poor  Fund of Knowledge:  Fair  Language:  Poor  Akathisia:  No  Handed:  Right  AIMS (if indicated):     Assets:  Housing Leisure Time Physical Health Resilience  ADL's:  Intact  Cognition:  Impaired,  Moderate  Sleep:      Mental Status Per Nursing Assessment::   On Admission:   dementia  Demographic Factors:  Male  Loss Factors: NA  Historical Factors: Impulsivity  Risk Reduction Factors:   Living with another person, especially a relative and Positive social support  Continued Clinical Symptoms:  none  Cognitive Features That Contribute To Risk:  None    Suicide Risk:  Minimal: No identifiable suicidal ideation.  Patients presenting with no risk factors but with morbid ruminations; may be classified as minimal risk based on the severity of the depressive symptoms    Plan Of Care/Follow-up recommendations:  Activity:  as tolerated Diet:  heart healthy diet  Cherylanne Ardelean, NP 04/27/2017, 11:36 AM

## 2017-04-27 NOTE — BH Assessment (Signed)
[facility] Assessment Progress Note  Per Thedore MinsMojeed Akintayo, MD, this pt has been psychiatrically cleared.  Pt presents under IVC, which he has rescinded.  Pt has been referred to Stacy GardnerErin Davenport, [facility]CSWA for further needs.  Doylene Canninghomas  Rhude, MA Triage Specialist [phone]

## 2017-04-27 NOTE — ED Provider Notes (Signed)
Patient pending placement.  Patient calm and without complaints on assessment.     Tilden Fossaees, Nobel Brar, MD 04/27/17 [phone]

## 2017-04-27 NOTE — Progress Notes (Signed)
CSW spoke to Daytime ED CSW  [facility] Number for report is: [phone] Pt's unit/room/bed number will be: TBD   Pt can arrive ASAP on 04/27/17  CSW will update RN and EDP.  Dorothe PeaJonathan F. Tashaya Ancrum, Francesco SorLCSWA, LCAS, CSI Clinical Social Worker Ph: [phone]
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
## 2017-04-27 NOTE — ED Notes (Addendum)
Patient calm, talking to himself in the mirror.  Laughing and smiling at his reflection and carrying on a conversation. Patient does not appear to process much information when sitter talks to him.  Patient would walk out of room if given the opportunity.  Sitter at doorway so patient does not wander off.

## 2017-04-27 NOTE — BH Assessment (Signed)
[facility]   [facility] to request [facility] following material be sent to them.  They are requesting chest x-ray; EKG; TSH levels; UA; UDS; copy of IVC papers.  This material can be sent to fax number [phone].  Clinician called [facility] and informed Lequita HaltMorgan of the request for information made by [facility].

## 2017-04-27 NOTE — ED Notes (Signed)
Per Floydene FlockMarcus, BH counselor, [place] requesting chest DG, TSH, EKG, urinalysis, urine drug screen, and copy of IVC faxed to [phone]. Pending TSH prior to requested information to be faxed.

## 2017-04-27 NOTE — Progress Notes (Signed)
CSW spoke with Mia and Tillman Abideebbie Dalton admissions representatives with [facility] regarding discharge plans. Mia stated "I need to get off the phone. We are trying to find psychiatric placement for him". CSW informed Mia and Tillman AbideDebbie Dalton that if plans were not arranged before 6PM 7/11 (24 hours since restraints discharged) patient would be transported to facility. CSW verified that Mia and Tillman AbideDebbie Dalton understood patient would be transported back to facility by 6PM- Mia responded "yes, now I have to go call places. I understand that he will come back here".   Discharge Plan:  [facility]  Patient will transport by EMS LCSW has handed off to evening SW as shift change is occurring.  LCSW has been instructed to update EDP and RN regarding plans. All information in chart sent through [facility] per request of facility.  Plan: [facility]  Stacy GardnerErin Thaine Garriga, [facility] Emergency Room Clinical Social Worker [phone]

## 2017-04-27 NOTE — ED Notes (Signed)
Patient sent by [facility] per [place], social worker's instructions.

## 2017-04-27 NOTE — ED Notes (Addendum)
[facility]; see ED note 04/27/17 0542.

## 2017-04-27 NOTE — Progress Notes (Signed)
CSW updated RN and EDP.  RN stated [facility] MonsTAR has been called. Please reconsult if future social work needs arise.    Dorothe PeaJonathan F. Dennise Bamber, Francesco SorLCSWA, LCAS, CSI Clinical Social Worker Ph: [phone]

## 2017-04-27 NOTE — ED Notes (Signed)
Pt. Peed on room floor

## 2017-04-27 NOTE — ED Notes (Signed)
Patient continues to be active in his room.  Not resting or sleeping at all since shift began.  Patient is not aggressive and easily directed back into his room if he tries to leave.

## 2017-04-27 NOTE — ED Notes (Signed)
Patient unlocking bed and trying to move the bed around the room.  Patient not aggressive just "busy" moving things in his room around.  Carrying blankets in his arms around the room.

## 2017-04-27 NOTE — Progress Notes (Signed)
CSW spoke with Coralee Northina [facility] regarding discharge plans. Patient is currently medically/ psychiatrically cleared for discharge. Patient was in restraints at 7:00PM 7/10. Will need to be 24 hours with out restraints. CSW will follow [facility] with [facility] before the end of business day on 7/11 to inform [facility] will be returning. Restraints will be DC'ed for 24 hours at 7:00PM on 7/11   Stacy GardnerErin Ronny Korff, [facility] Emergency Room Clinical Social Worker [phone]

## 2019-01-11 IMAGING — CR DG CHEST 2V
2 series · 2 of 2 positions shown · non-contrast
Comparison: 04/14/2017

CLINICAL DATA: Altered mental status

EXAM:
CHEST  2 VIEW

[w chest lat]
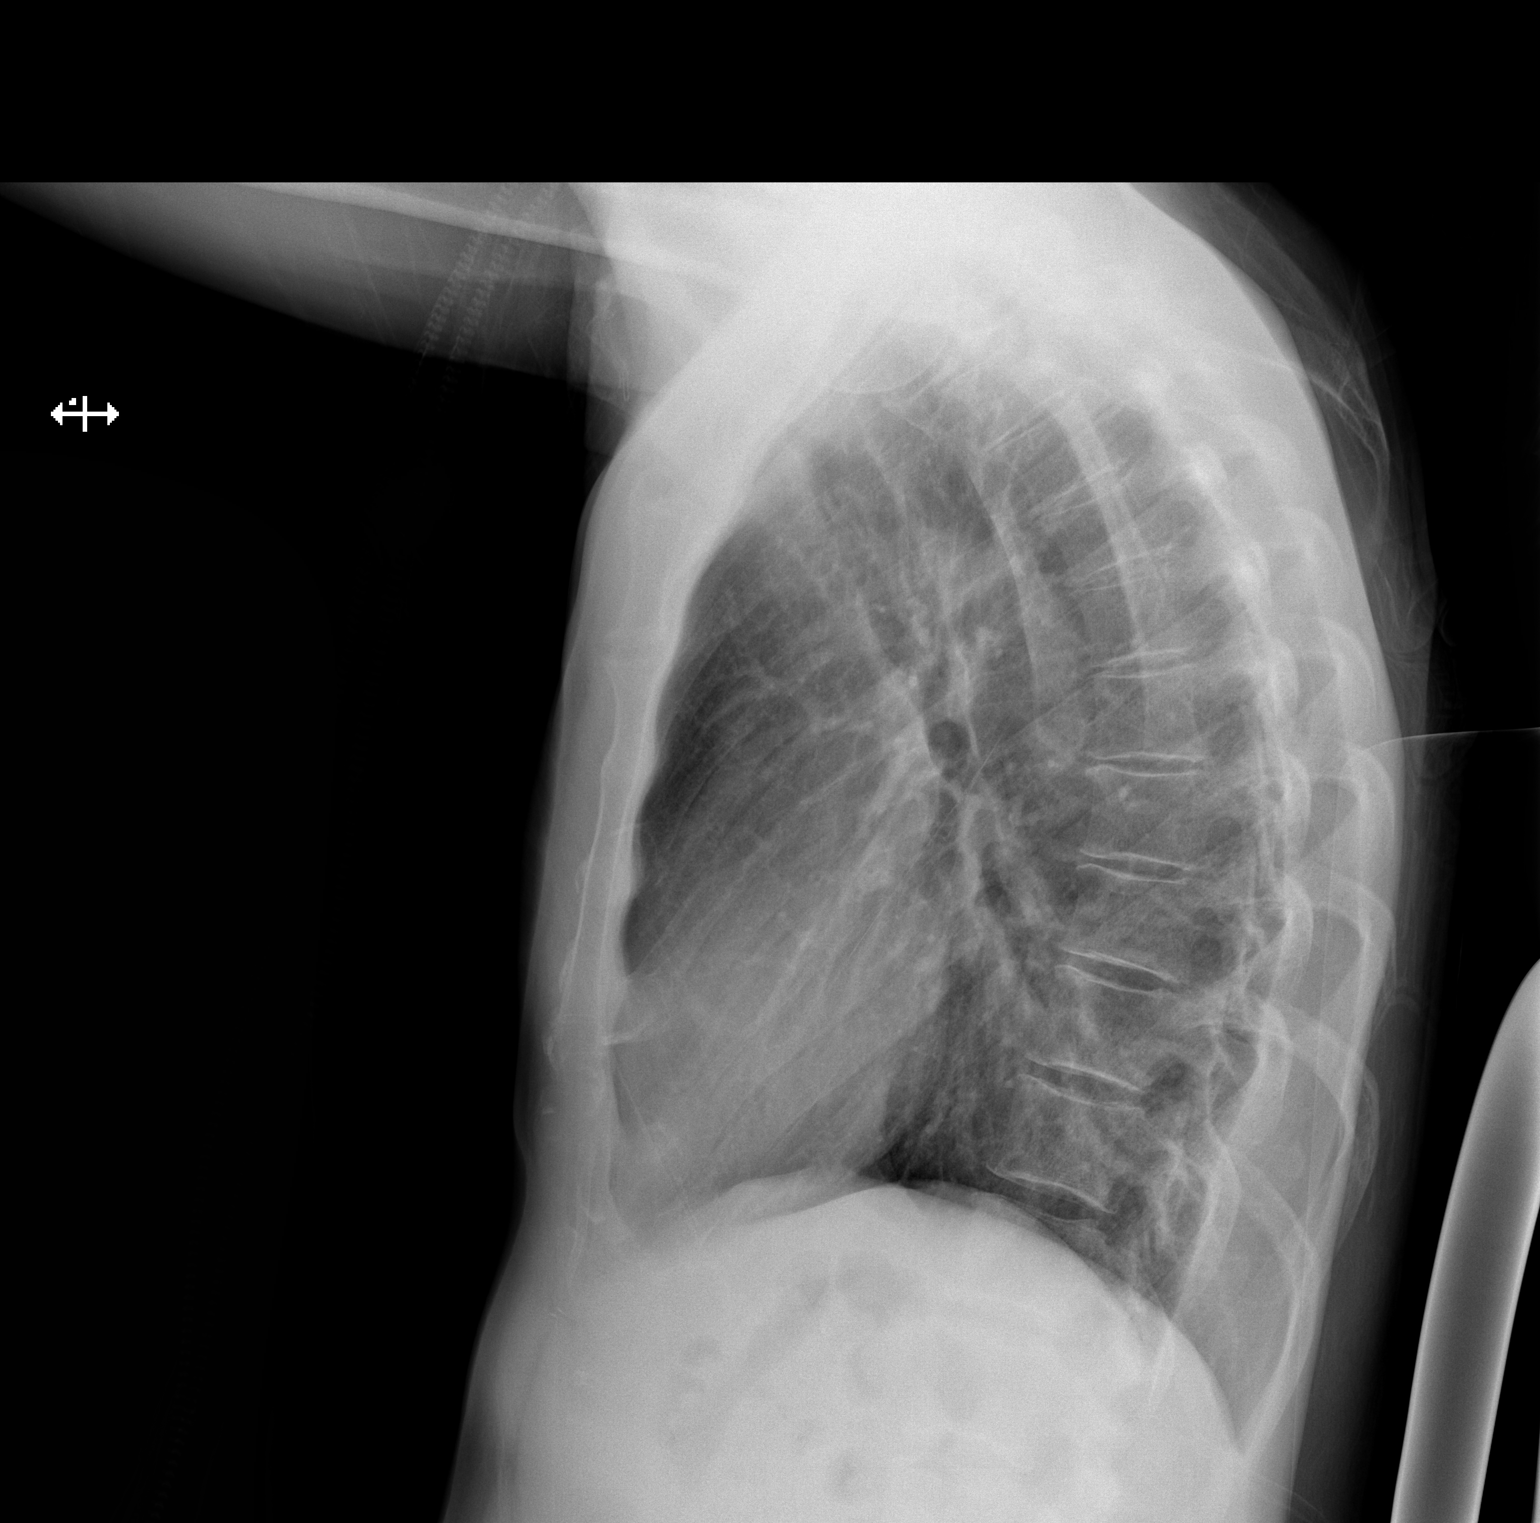

[x chest ap]
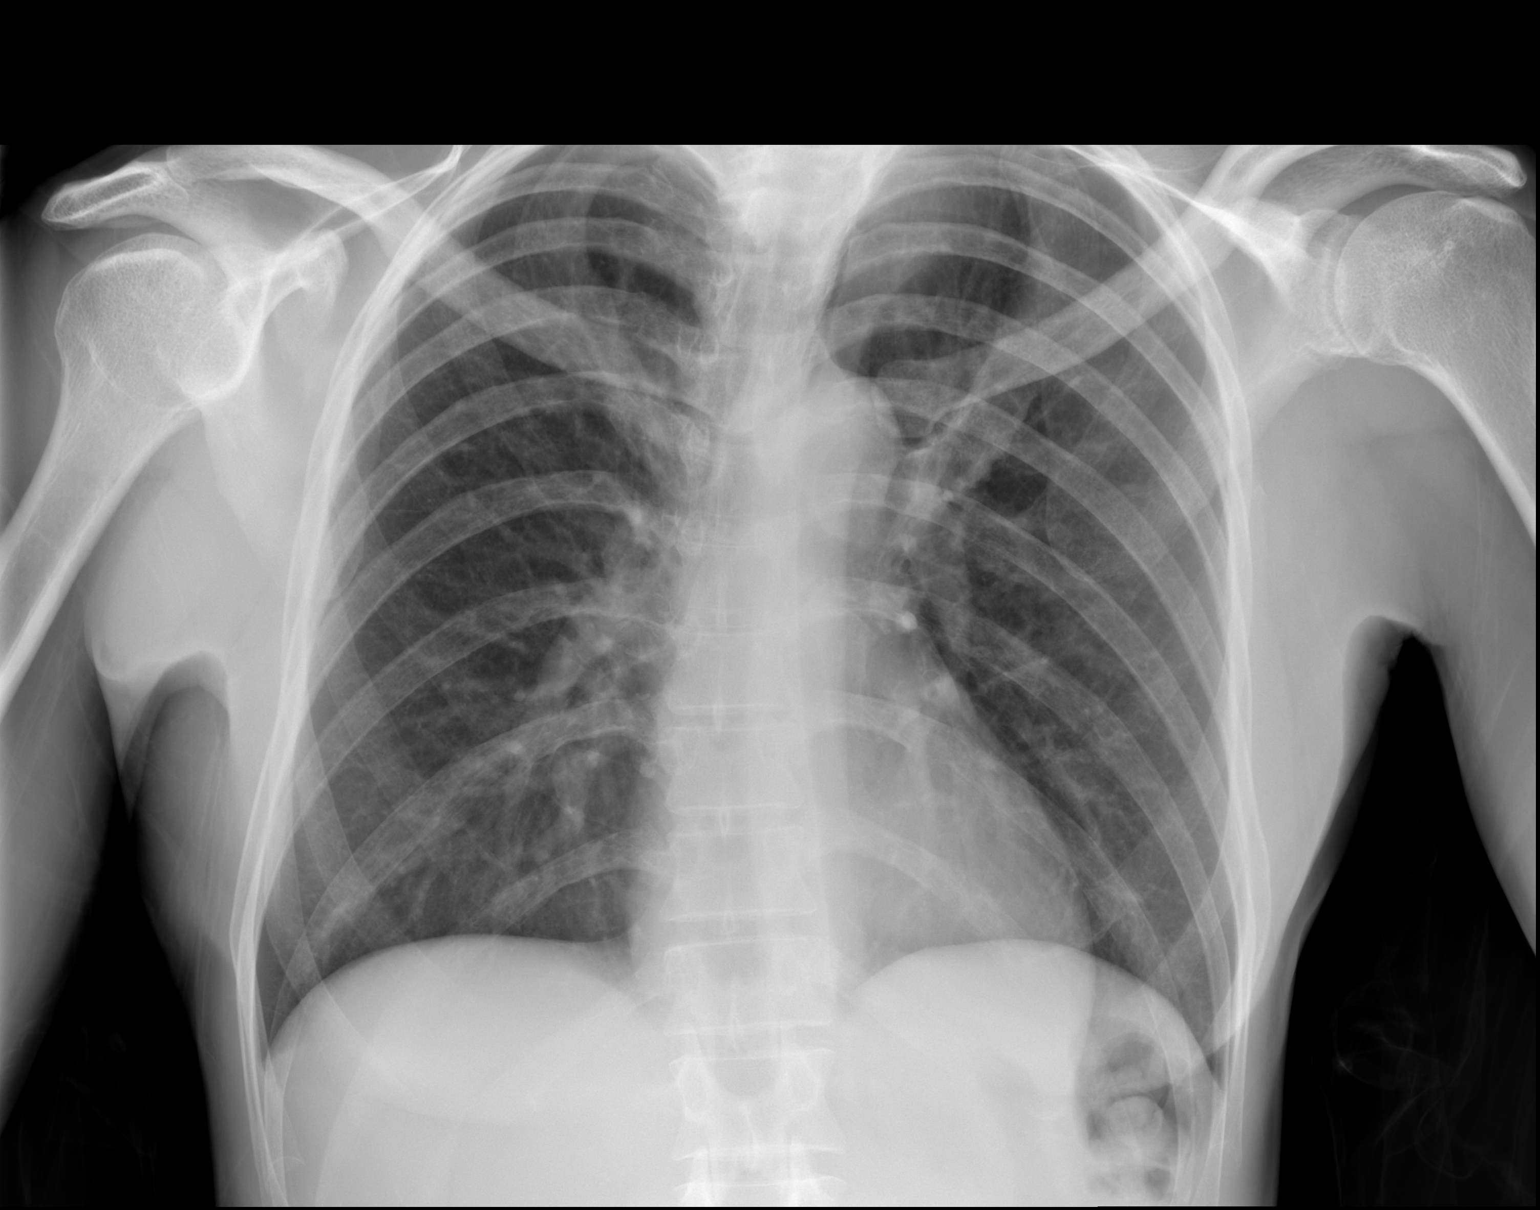

[2 of 2 positions shown; findings below may reference images not displayed]

FINDINGS: The heart size and mediastinal contours are within normal limits.
Both lungs are clear. The visualized skeletal structures are
unremarkable.
IMPRESSION: No active cardiopulmonary disease.
